# Patient Record
Sex: Female | Born: 2000 | Race: White | Hispanic: No | Marital: Single | Smoking: Never smoker
Health system: Southern US, Community
[De-identification: ages and names within clinical notes are randomized; demographics above are authoritative.]

## PROBLEM LIST (undated history)

## (undated) ENCOUNTER — Emergency Department (HOSPITAL_COMMUNITY): Payer: 59 | Source: Home / Self Care

## (undated) DIAGNOSIS — F329 Major depressive disorder, single episode, unspecified: Secondary | ICD-10-CM

## (undated) DIAGNOSIS — F32A Depression, unspecified: Secondary | ICD-10-CM

## (undated) HISTORY — PX: KNEE SURGERY: SHX244

## (undated) HISTORY — PX: WISDOM TOOTH EXTRACTION: SHX21

---
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
## 2018-08-06 IMAGING — CT CT ABD-PELV W/ CM
2 of 4 series · 16 of 46 positions shown, 18 images · IV contrast (iopamidol)
Comparison: None.

CLINICAL DATA: Periumbilical pain radiating to the right lower
quadrant.

EXAM:
CT ABDOMEN AND PELVIS WITH CONTRAST
TECHNIQUE: Multidetector CT imaging of the abdomen and pelvis was performed
using the standard protocol following bolus administration of
intravenous contrast.
CONTRAST:  100mL ETRQ8F-PYY IOPAMIDOL (ETRQ8F-PYY) INJECTION 61%

[Series 3: a/p w/ 5mm · axial · 0.88mm/px · z∈[+869,+1269]mm · 13 of 89 slices shown, 15 images]
[im 5/89  soft-tissue]
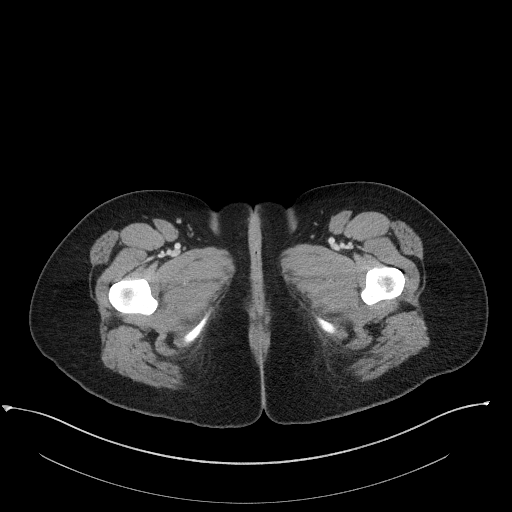
[im 5/89  bone]
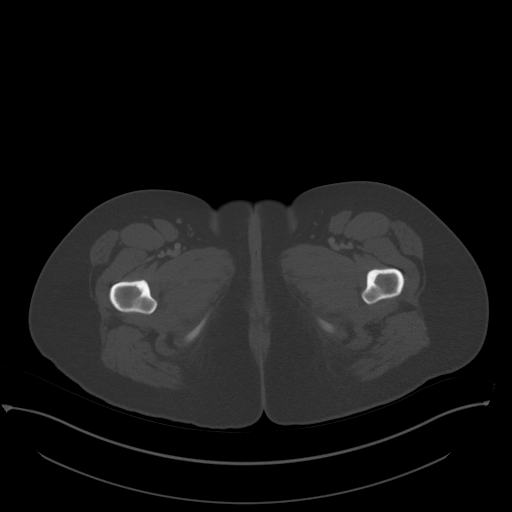
[im 13/89  soft-tissue]
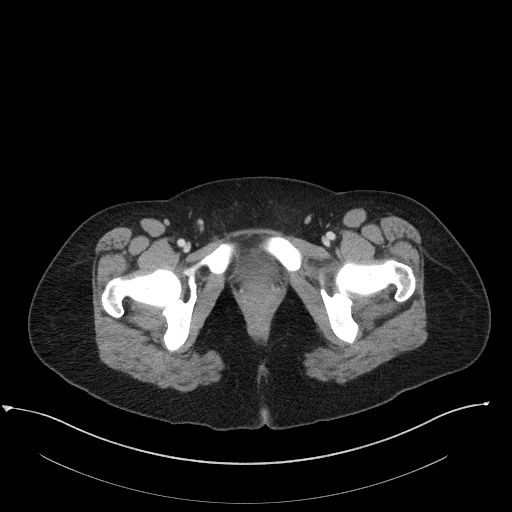
[im 21/89  soft-tissue]
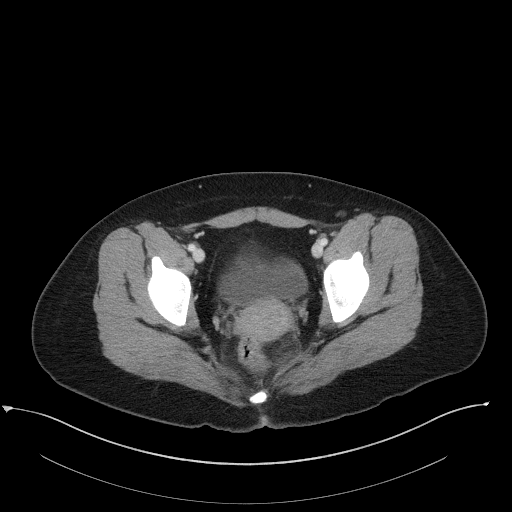
[im 25/89  soft-tissue]
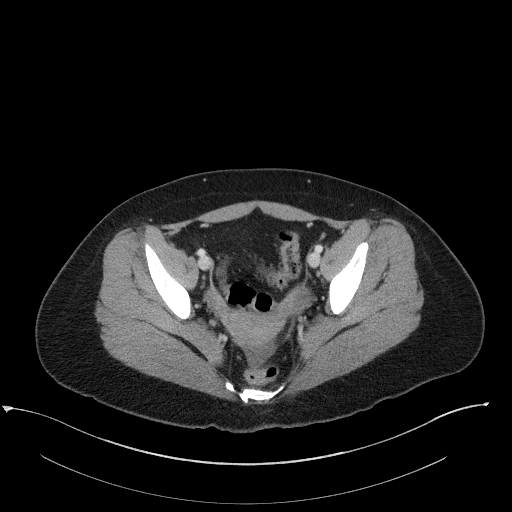
[im 33/89  soft-tissue]
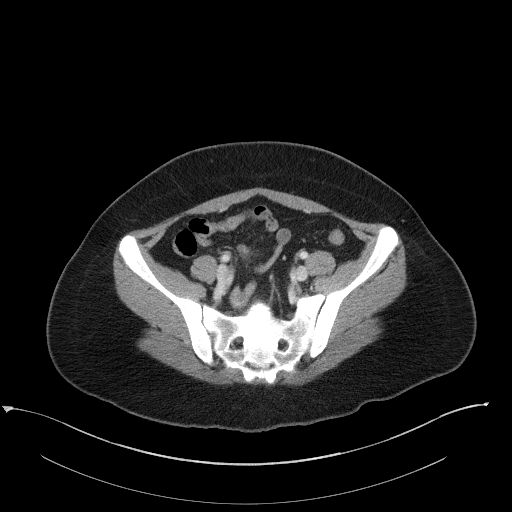
[im 37/89  soft-tissue]
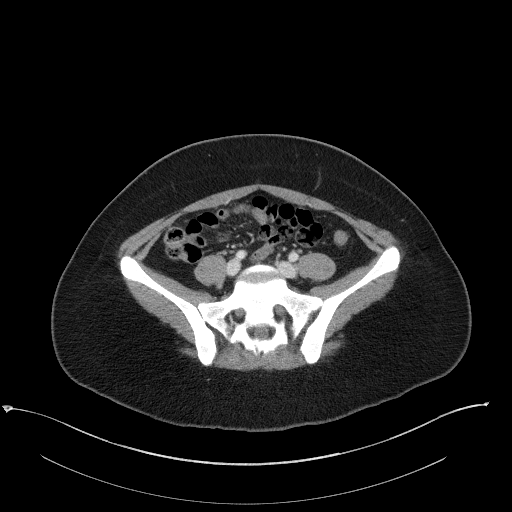
[im 45/89  soft-tissue]
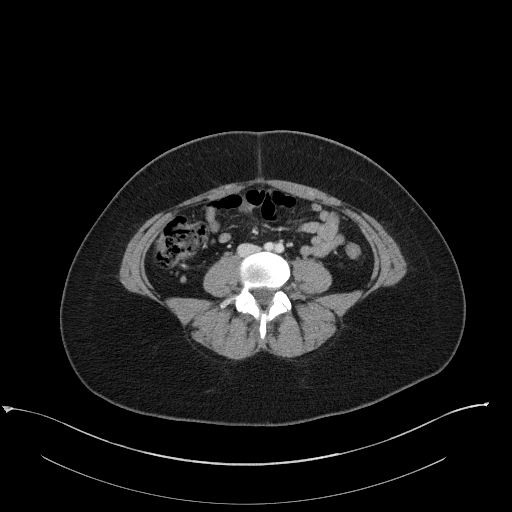
[im 53/89  soft-tissue]
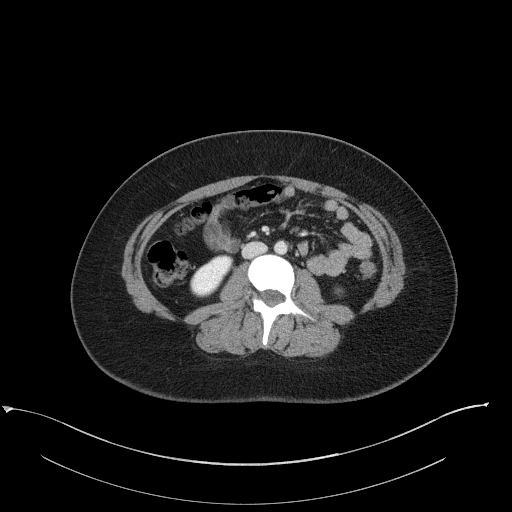
[im 57/89  soft-tissue]
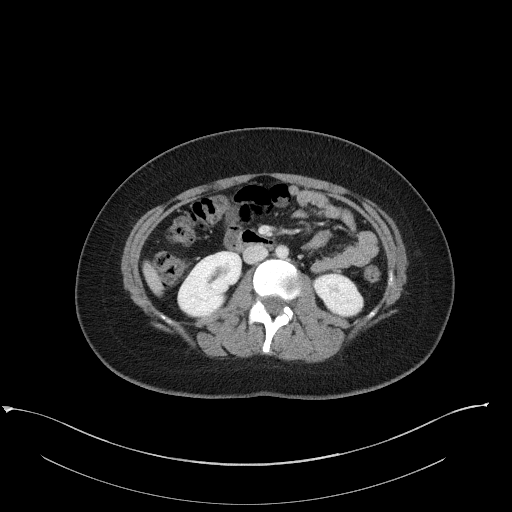
[im 57/89  bone]
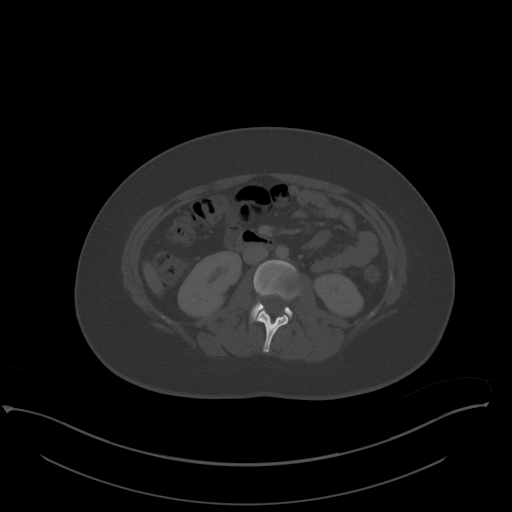
[im 65/89  soft-tissue]
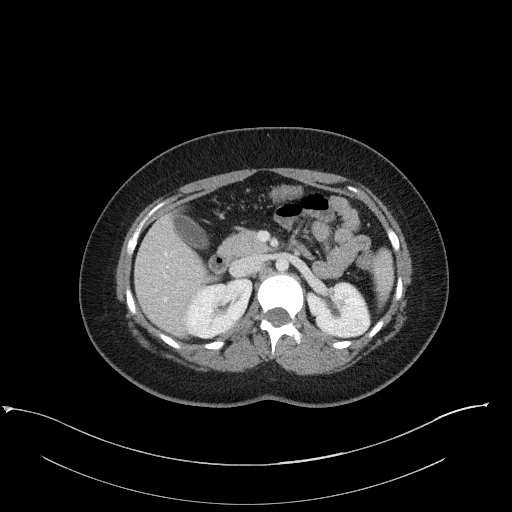
[im 69/89  soft-tissue]
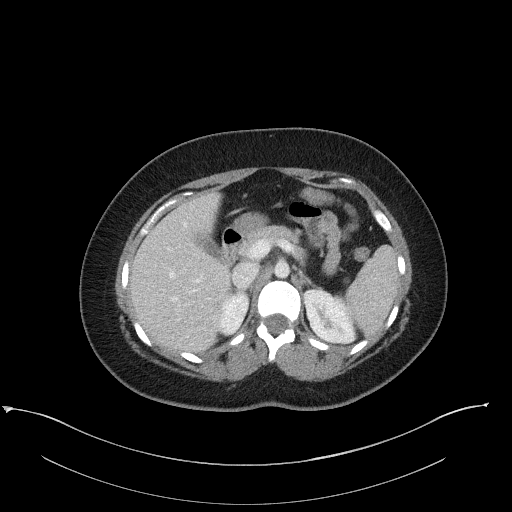
[im 77/89  soft-tissue]
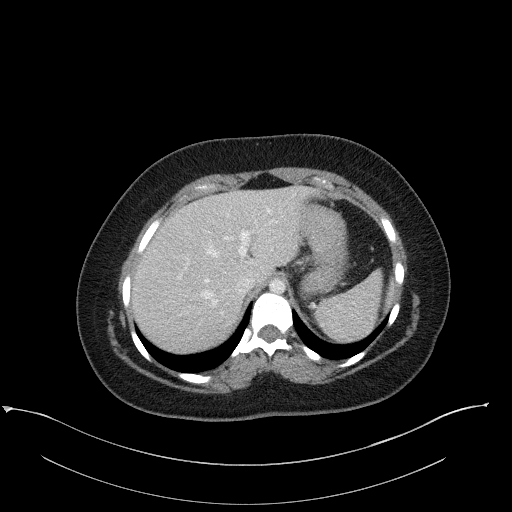
[im 85/89  soft-tissue]
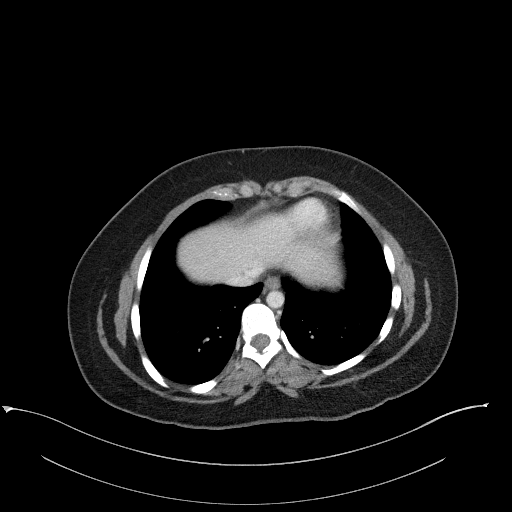

[Series 6: a/p w/ cor · coronal · 0.70mm/px · 3 of 138 slices shown]
[im 46/138  soft-tissue]
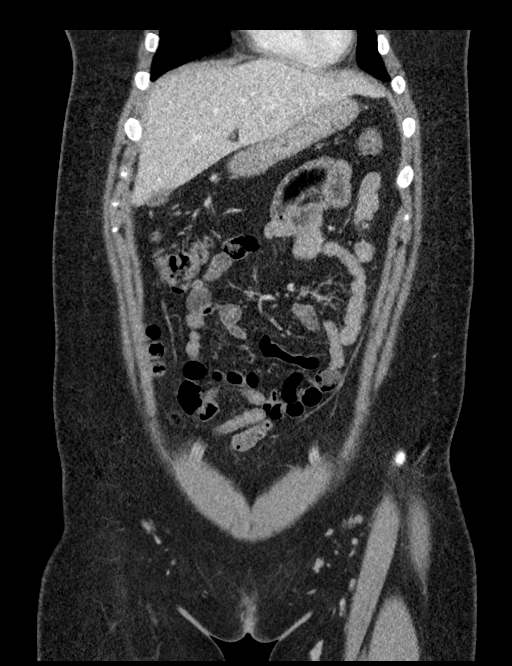
[im 61/138  soft-tissue]
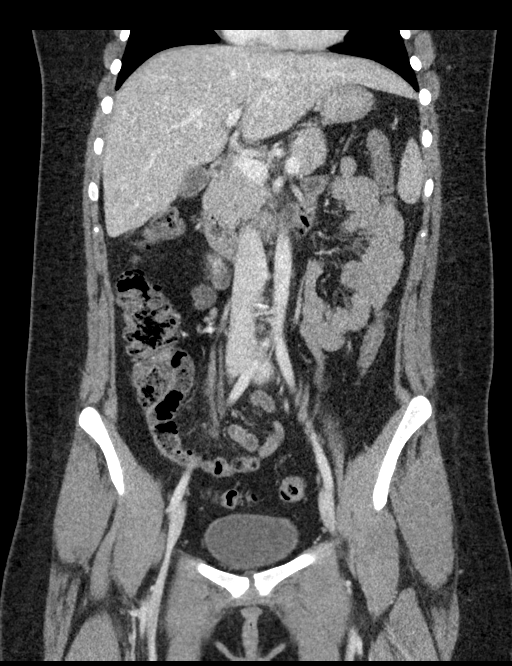
[im 77/138  soft-tissue]
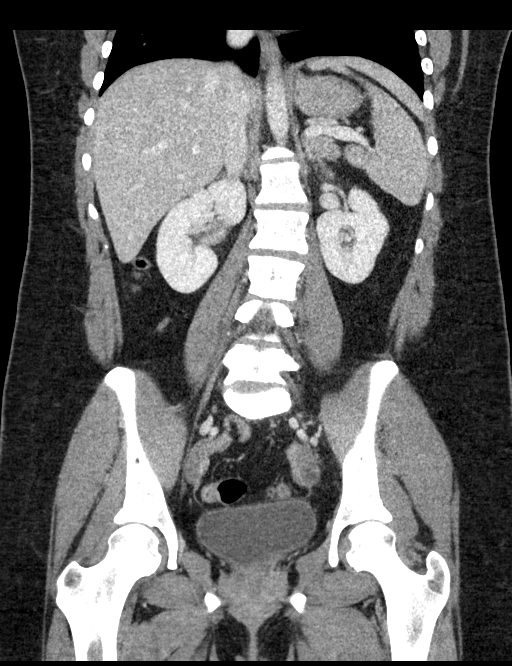

[16 of 46 positions shown; findings below may reference images not displayed]

FINDINGS: Lower chest: No acute abnormality.

Hepatobiliary: No focal liver abnormality is seen. No gallstones,
gallbladder wall thickening, or biliary dilatation.

Pancreas: Unremarkable. No pancreatic ductal dilatation or
surrounding inflammatory changes.

Spleen: Normal in size without focal abnormality.

Adrenals/Urinary Tract: Adrenal glands are unremarkable. Kidneys are
normal, without renal calculi, focal lesion, or hydronephrosis.
Bladder is unremarkable.

Stomach/Bowel: Stomach is within normal limits. The appendix is
fluid-filled and normal in caliber. Minimal mesenteric stranding at
the tip of the appendix, axial image 42/ 89, sequence 3 and coronal
image 79/138, sequence 6. No evidence of bowel wall thickening,
distention, or inflammatory changes.

Vascular/Lymphatic: No significant vascular findings are present. No
enlarged abdominal or pelvic lymph nodes. Shotty mesenteric lymph
nodes in the right lower quadrant and left upper quadrant of the
abdomen.

Reproductive: Uterus and bilateral adnexa are unremarkable.

Other: No abdominal wall hernia or abnormality. No abdominopelvic
ascites.

Musculoskeletal: No acute osseous findings. Mild levoconvex
scoliosis of the lumbosacral spine.
IMPRESSION: Normal caliber of the appendix with minimal mesenteric stranding at
the tip of the appendix. Very early tip appendicitis cannot be
entirely excluded.

Shotty mesenteric lymph nodes in the right lower quadrant and left
upper quadrant of the abdomen. Nonspecific finding which may be seen
with mild reactive enlargement of lymph nodes, or mesenteric
lymphadenitis.

## 2019-04-22 ENCOUNTER — Other Ambulatory Visit: Payer: Self-pay | Admitting: Physician Assistant

## 2019-04-22 DIAGNOSIS — R1011 Right upper quadrant pain: Secondary | ICD-10-CM

## 2019-05-01 ENCOUNTER — Ambulatory Visit
Admission: RE | Admit: 2019-05-01 | Discharge: 2019-05-01 | Disposition: A | Discharge status: Home / Self Care | Payer: No Typology Code available for payment source | Source: Ambulatory Visit | Attending: Physician Assistant | Admitting: Physician Assistant

## 2019-05-01 DIAGNOSIS — R1011 Right upper quadrant pain: Secondary | ICD-10-CM

## 2019-10-25 ENCOUNTER — Inpatient Hospital Stay (HOSPITAL_COMMUNITY)
Admission: AD | Admit: 2019-10-25 | Discharge: 2019-10-25 | Disposition: A | Discharge status: Home / Self Care | Payer: No Typology Code available for payment source | Attending: Obstetrics and Gynecology | Admitting: Obstetrics and Gynecology

## 2019-10-25 ENCOUNTER — Inpatient Hospital Stay (HOSPITAL_COMMUNITY): Payer: No Typology Code available for payment source

## 2019-10-25 ENCOUNTER — Encounter (HOSPITAL_COMMUNITY): Payer: Self-pay | Admitting: Obstetrics and Gynecology

## 2019-10-25 ENCOUNTER — Other Ambulatory Visit: Payer: Self-pay

## 2019-10-25 DIAGNOSIS — R109 Unspecified abdominal pain: Secondary | ICD-10-CM | POA: Diagnosis not present

## 2019-10-25 DIAGNOSIS — Z886 Allergy status to analgesic agent status: Secondary | ICD-10-CM | POA: Diagnosis not present

## 2019-10-25 DIAGNOSIS — O209 Hemorrhage in early pregnancy, unspecified: Secondary | ICD-10-CM | POA: Diagnosis not present

## 2019-10-25 DIAGNOSIS — Z3A11 11 weeks gestation of pregnancy: Secondary | ICD-10-CM | POA: Insufficient documentation

## 2019-10-25 DIAGNOSIS — O4690 Antepartum hemorrhage, unspecified, unspecified trimester: Secondary | ICD-10-CM

## 2019-10-25 DIAGNOSIS — O4691 Antepartum hemorrhage, unspecified, first trimester: Secondary | ICD-10-CM | POA: Diagnosis not present

## 2019-10-25 DIAGNOSIS — O26899 Other specified pregnancy related conditions, unspecified trimester: Secondary | ICD-10-CM

## 2019-10-25 DIAGNOSIS — Z6790 Unspecified blood type, Rh positive: Secondary | ICD-10-CM

## 2019-10-25 DIAGNOSIS — O3680X0 Pregnancy with inconclusive fetal viability, not applicable or unspecified: Secondary | ICD-10-CM

## 2019-10-25 DIAGNOSIS — O26891 Other specified pregnancy related conditions, first trimester: Secondary | ICD-10-CM | POA: Diagnosis not present

## 2019-10-25 DIAGNOSIS — O26851 Spotting complicating pregnancy, first trimester: Secondary | ICD-10-CM | POA: Diagnosis present

## 2019-10-25 LAB — URINALYSIS, ROUTINE W REFLEX MICROSCOPIC
Bilirubin Urine: NEGATIVE
Glucose, UA: NEGATIVE mg/dL
Ketones, ur: NEGATIVE mg/dL
Leukocytes,Ua: NEGATIVE
Nitrite: NEGATIVE
Protein, ur: NEGATIVE mg/dL
Specific Gravity, Urine: 1.006 (ref 1.005–1.030)
pH: 5 (ref 5.0–8.0)

## 2019-10-25 LAB — WET PREP, GENITAL
Clue Cells Wet Prep HPF POC: NONE SEEN
Sperm: NONE SEEN
Trich, Wet Prep: NONE SEEN
Yeast Wet Prep HPF POC: NONE SEEN

## 2019-10-25 LAB — CBC
HCT: 45.8 % (ref 36.0–46.0)
Hemoglobin: 15.1 g/dL — ABNORMAL HIGH (ref 12.0–15.0)
MCH: 29 pg (ref 26.0–34.0)
MCHC: 33 g/dL (ref 30.0–36.0)
MCV: 88.1 fL (ref 80.0–100.0)
Platelets: 317 10*3/uL (ref 150–400)
RBC: 5.2 MIL/uL — ABNORMAL HIGH (ref 3.87–5.11)
RDW: 11.6 % (ref 11.5–15.5)
WBC: 7.7 10*3/uL (ref 4.0–10.5)
nRBC: 0 % (ref 0.0–0.2)

## 2019-10-25 LAB — ABO/RH: ABO/RH(D): A POS

## 2019-10-25 LAB — HCG, QUANTITATIVE, PREGNANCY: hCG, Beta Chain, Quant, S: 1313 m[IU]/mL — ABNORMAL HIGH (ref <5)

## 2019-10-25 NOTE — Discharge Instructions (Signed)

## 2019-10-25 NOTE — MAU Note (Signed)
Rhonda Massey is a 19 y.o. here in MAU reporting: started cramping yesterday, thought she was just constipated. Last night when she went to the bathroom she noticed some bleeding, only when she wipes. It was pink, today it is brighter and saw some small clots. States she is wearing a panty liner but has not seen any on there. Last IC was yesterday. +UPT at the beginning of April.   LMP: 08/05/19  Onset of complaint: yesterday  Pain score: 3/10  Vitals:   10/25/19 1224  BP: 116/71  Pulse: 96  Resp: 16  Temp: 98.6 F (37 C)  SpO2: 99%     Lab orders placed from triage: UPT  Pt attempted to give urine sample prior to being triaged but was unable to. Pt has document saved on phone from the [facility]" in [place], has hcg drawn on 10/08/19 and it was 2575.

## 2019-10-25 NOTE — MAU Provider Note (Signed)
History     CSN: [id number]  Arrival date and time: 10/25/19 1148   First Provider Initiated Contact with Patient 10/25/19 1240      Chief Complaint  Patient presents with  . Possible Pregnancy  . Abdominal Pain  . Vaginal Bleeding   19 y.o. G2P0010 @[redacted]w[redacted]d  by unsure LMP presenting with cramping and spotting. Reports onset yesterday. She felt the cramping was d/t constipation and has improved after having a BM today. Spotting has been light and is only when she wipes. Last had sex this am. Denies urinary sx. No vaginal discharge. No fevers. She was seen at [facility] and had 06-26-1971 with showed a possible gestational sac, and had qhcg of 2500 (pt had results on phone).   OB History    Gravida  2   Para      Term      Preterm      AB  1   Living        SAB  1   TAB      Ectopic      Multiple      Live Births              Past Medical History:  Diagnosis Date  . Depression Knee Dysplasia    Past Surgical History:  Procedure Laterality Date  . LAPAROSCOPIC APPENDECTOMY N/A 03/02/2017   Procedure: APPENDECTOMY LAPAROSCOPIC;  Surgeon: 03/04/2017, MD;  Location: [facility] OR;  Service: General;  Laterality: N/A;  . WISDOM TOOTH EXTRACTION      Family History  Problem Relation Age of Onset  . Cancer Maternal Grandmother        breast    Social History   Tobacco Use  . Smoking status: Never Smoker  . Smokeless tobacco: Never Used  Substance Use Topics  . Alcohol use: No  . Drug use: No    Allergies:  Allergies  Allergen Reactions  . Ibuprofen Nausea And Vomiting    Medications Prior to Admission  Medication Sig Dispense Refill Last Dose  . Norgestimate-Ethinyl Estradiol Triphasic (TRI-SPRINTEC) 0.18/0.215/0.25 MG-35 MCG tablet Take 1 tablet by mouth daily.     . traMADol (ULTRAM) 50 MG tablet Take 1 tablet (50 mg total) by mouth every 6 (six) hours as needed for moderate pain. 15 tablet 0     Review of Systems   Constitutional: Negative for chills and fever.  Gastrointestinal: Positive for abdominal pain.  Genitourinary: Positive for vaginal bleeding. Negative for dysuria and vaginal discharge.   Physical Exam   Blood pressure 116/71, pulse 96, temperature 98.6 F (37 C), temperature source Oral, resp. rate 16, height 5\' 1"  (1.549 m), weight 73.7 kg, last menstrual period 08/05/2019, SpO2 99 %.  Physical Exam  Nursing note and vitals reviewed. Constitutional: She is oriented to person, place, and time. She appears well-developed and well-nourished. No distress.  HENT:  Head: Normocephalic and atraumatic.  Cardiovascular: Normal rate.  Respiratory: Effort normal. No respiratory distress.  GI: Soft. She exhibits no distension and no mass. There is no abdominal tenderness. There is no rebound and no guarding.  Genitourinary:    Genitourinary Comments: External: no lesions or erythema Uterus: non enlarged, anteverted, non tender, no CMT Adnexae: no masses, no tenderness left, no tenderness right Cervix closed   Musculoskeletal:        General: Normal range of motion.     Cervical back: Normal range of motion.  Neurological: She is alert and oriented to  person, place, and time.  Skin: Skin is warm and dry.  Psychiatric: She has a normal mood and affect.   Results for orders placed or performed during the hospital encounter of 10/25/19 (from the past 24 hour(s))  CBC     Status: Abnormal   Collection Time: 10/25/19 12:43 PM  Result Value Ref Range   WBC 7.7 4.0 - 10.5 K/uL   RBC 5.20 (H) 3.87 - 5.11 MIL/uL   Hemoglobin 15.1 (H) 12.0 - 15.0 g/dL   HCT 45.8 36.0 - 46.0 %   MCV 88.1 80.0 - 100.0 fL   MCH 29.0 26.0 - 34.0 pg   MCHC 33.0 30.0 - 36.0 g/dL   RDW 11.6 11.5 - 15.5 %   Platelets 317 150 - 400 K/uL   nRBC 0.0 0.0 - 0.2 %  hCG, quantitative, pregnancy     Status: Abnormal   Collection Time: 10/25/19 12:43 PM  Result Value Ref Range   hCG, Beta Chain, Quant, S 1,313 (H) <5  mIU/mL  ABO/Rh     Status: None   Collection Time: 10/25/19 12:43 PM  Result Value Ref Range   ABO/RH(D) A POS    No rh immune globuloin      NOT A RH IMMUNE GLOBULIN CANDIDATE, PT RH POSITIVE Performed at [facility], [place] [address], [place], [place] [address]   Wet prep, genital     Status: Abnormal   Collection Time: 10/25/19 12:55 PM   Specimen: PATH Cytology Cervicovaginal Ancillary Only  Result Value Ref Range   Yeast Wet Prep HPF POC NONE SEEN NONE SEEN   Trich, Wet Prep NONE SEEN NONE SEEN   Clue Cells Wet Prep HPF POC NONE SEEN NONE SEEN   WBC, Wet Prep HPF POC MANY (A) NONE SEEN   Sperm NONE SEEN   Urinalysis, Routine w reflex microscopic     Status: Abnormal   Collection Time: 10/25/19  1:23 PM  Result Value Ref Range   Color, Urine STRAW (A) YELLOW   APPearance CLEAR CLEAR   Specific Gravity, Urine 1.006 1.005 - 1.030   pH 5.0 5.0 - 8.0   Glucose, UA NEGATIVE NEGATIVE mg/dL   Hgb urine dipstick MODERATE (A) NEGATIVE   Bilirubin Urine NEGATIVE NEGATIVE   Ketones, ur NEGATIVE NEGATIVE mg/dL   Protein, ur NEGATIVE NEGATIVE mg/dL   Nitrite NEGATIVE NEGATIVE   Leukocytes,Ua NEGATIVE NEGATIVE   RBC / HPF 0-5 0 - 5 RBC/hpf   WBC, UA 0-5 0 - 5 WBC/hpf   Bacteria, UA RARE (A) NONE SEEN   Squamous Epithelial / LPF 0-5 0 - 5   Mucus PRESENT    US OB LESS THAN 14 WEEKS WITH OB TRANSVAGINAL  Result Date: 10/25/2019 CLINICAL DATA:  Vaginal bleeding and cramping. EXAM: OBSTETRIC <14 WK [place] AND TRANSVAGINAL OB US TECHNIQUE: Both transabdominal and transvaginal ultrasound examinations were performed for complete evaluation of the gestation as well as the maternal uterus, adnexal regions, and pelvic cul-de-sac. Transvaginal technique was performed to assess early pregnancy. COMPARISON:  None. FINDINGS: Intrauterine gestational sac: None Yolk sac:  N/A Embryo:  N/A Cardiac Activity: N/A Heart Rate: N/A bpm Subchorionic hemorrhage:  N/A Maternal uterus/adnexae: Both ovaries are  normal.  No adnexal mass. Moderate thickening of the endometrium measuring up to 14.6 mm no endometrial fluid. The cervix appears normal. IMPRESSION: 1. No intrauterine gestational sac is identified. 2. Normal ovaries.  No adnexal mass. Electronically Signed   By: Marijo Sanes M.D.   On:  10/25/2019 13:53   MAU Course  Procedures  MDM Labs and [place] ordered and reviewed. No IUP seen on [place] and given qhcg is lower today than when checked at outside facility (not on file) I suspect failed pregnancy but cannot r/o early pregnancy or ectopic pregnancy. Discussed findings with pt. Plan for f/u qhcg in 2 days in MAU (pt has to work during clinic hours). Stable for discharge home.   Assessment and Plan   1. Pregnancy, location unknown   2. Vaginal bleeding in pregnancy   3. Abdominal cramping affecting pregnancy   4. Blood type, Rh positive    Discharge home Follow up at MAU on 10/27/19 for qhcg SAB/ectopic return precautions Tylenol/heating pad prn  Allergies as of 10/25/2019      Reactions   Ibuprofen Nausea And Vomiting      Medication List    STOP taking these medications   traMADol 50 MG tablet Commonly known as: ULTRAM   Tri-Sprintec 0.18/0.215/0.25 MG-35 MCG tablet Generic drug: Norgestimate-Ethinyl Estradiol Triphasic      Donette Larry, CNM 10/25/2019, 12:41 PM

## 2019-10-27 ENCOUNTER — Inpatient Hospital Stay (HOSPITAL_COMMUNITY)
Admission: AD | Admit: 2019-10-27 | Discharge: 2019-10-27 | Disposition: A | Discharge status: Home / Self Care | Payer: No Typology Code available for payment source | Attending: Obstetrics & Gynecology | Admitting: Obstetrics & Gynecology

## 2019-10-27 DIAGNOSIS — O039 Complete or unspecified spontaneous abortion without complication: Secondary | ICD-10-CM | POA: Insufficient documentation

## 2019-10-27 DIAGNOSIS — Z6790 Unspecified blood type, Rh positive: Secondary | ICD-10-CM

## 2019-10-27 DIAGNOSIS — Z3A11 11 weeks gestation of pregnancy: Secondary | ICD-10-CM | POA: Diagnosis not present

## 2019-10-27 LAB — HCG, QUANTITATIVE, PREGNANCY: hCG, Beta Chain, Quant, S: 128 m[IU]/mL — ABNORMAL HIGH (ref <5)

## 2019-10-27 LAB — GC/CHLAMYDIA PROBE AMP (~~LOC~~) NOT AT ARMC
Chlamydia: NEGATIVE
Comment: NEGATIVE
Comment: NORMAL
Neisseria Gonorrhea: NEGATIVE

## 2019-10-27 NOTE — MAU Note (Signed)
.   Rhonda Massey is a 19 y.o. at [redacted]w[redacted]d here in MAU reporting: she is here for repeat labs. Continues to have small amount of vaginal bleeding with lower abdominal cramping LMP: 08/05/19 Onset of complaint: ongoing Pain score: 7 Vitals:   10/27/19 1630  BP: 123/74  Pulse: 92  Resp: 18  Temp: 98.9 F (37.2 C)  SpO2: 100%     FHT Lab orders placed from triage: QUANT

## 2019-10-27 NOTE — Discharge Instructions (Signed)
Managing Pregnancy Loss Pregnancy loss can happen any time during a pregnancy. Often the cause is not known. It is rarely because of anything you did. Pregnancy loss in early pregnancy (during the first trimester) is called a miscarriage. This type of pregnancy loss is the most common. Pregnancy loss that happens after 20 weeks of pregnancy is called fetal demise if the baby's heart stops beating before birth. Fetal demise is much less common. Some women experience spontaneous labor shortly after fetal demise resulting in a stillborn birth (stillbirth). Any pregnancy loss can be devastating. You will need to recover both physically and emotionally. Most women are able to get pregnant again after a pregnancy loss and deliver a healthy baby. How to manage emotional recovery  Pregnancy loss is very hard emotionally. You may feel many different emotions while you grieve. You may feel sad and angry. You may also feel guilty. It is normal to have periods of crying. Emotional recovery can take longer than physical recovery. It is different for everyone. Taking these steps can help you in managing this loss:  Remember that it is unlikely you did anything to cause the pregnancy loss.  Share your thoughts and feelings with friends, family, and your partner. Remember that your partner is also recovering emotionally.  Make sure you have a good support system. Do not spend too much time alone.  Meet with a pregnancy loss counselor or join a pregnancy loss support group.  Get enough sleep and eat a healthy diet. Return to regular exercise when you have recovered physically.  Do not use drugs or alcohol to manage your emotions.  Consider seeing a mental health professional to help you recover emotionally.  Ask a friend or loved one to help you decide what to do with any clothing and nursery items you received for your baby. In the case of a stillbirth, many women benefit from taking additional steps in the  grieving process. You may want to:  Hold your baby after the birth.  Name your baby.  Request a birth certificate.  Create a keepsake such as handprints or footprints.  Dress your baby and have a picture taken.  Make funeral arrangements.  Ask for a baptism or blessing. Hospitals have staff members who can help you with all these arrangements. How to recognize emotional stress It is normal to have emotional stress after a pregnancy loss. But emotional stress that lasts a long time or becomes severe requires treatment. Watch out for these signs of severe emotional stress:  Sadness, anger, or guilt that is not going away and is interfering with your normal activities.  Relationship problems that have occurred or gotten worse since the pregnancy loss.  Signs of depression that last longer than 2 weeks. These may include: ? Sadness. ? Anxiety. ? Hopelessness. ? Loss of interest in activities you enjoy. ? Inability to concentrate. ? Trouble sleeping or sleeping too much. ? Loss of appetite or overeating. ? Thoughts of death or of hurting yourself. Follow these instructions at home:  Take over-the-counter and prescription medicines only as told by your health care provider.  Rest at home until your energy level returns. Return to your normal activities as told by your health care provider. Ask your health care provider what activities are safe for you.  When you are ready, meet with your health care provider to discuss steps to take for a future pregnancy.  Keep all follow-up visits as told by your health care provider. This is important.  Where to find support  To help you and your partner with the process of grieving, talk with your health care provider or seek counseling.  Consider meeting with others who have experienced pregnancy loss. Ask your health care provider about support groups and resources. Where to find more information  [place] Department of Health and Human  Services Office on Women's Health: www.womenshealth.gov  American Pregnancy Association: www.americanpregnancy.org Contact a health care provider if:  You continue to experience grief, sadness, or lack of motivation for everyday activities, and those feelings do not improve over time.  You are struggling to recover emotionally, especially if you are using alcohol or substances to help. Get help right away if:  You have thoughts of hurting yourself or others. If you ever feel like you may hurt yourself or others, or have thoughts about taking your own life, get help right away. You can go to your nearest emergency department or call:  Your local emergency services (911 in the [place]  A suicide crisis helpline, such as the National Suicide Prevention Lifeline at 1-800-273-8255. This is open 24 hours a day. Summary  Any pregnancy loss can be difficult physically and emotionally.  You may experience many different emotions while you grieve. Emotional recovery can last longer than physical recovery.  It is normal to have emotional stress after a pregnancy loss. But emotional stress that lasts a long time or becomes severe requires treatment.  See your health care provider if you are struggling emotionally after a pregnancy loss. This information is not intended to replace advice given to you by your health care provider. Make sure you discuss any questions you have with your health care provider. Document Revised: 10/09/2018 Document Reviewed: 08/30/2017 Elsevier Patient Education  2020 Elsevier Inc.      Miscarriage A miscarriage is the loss of an unborn baby (fetus) before the 20th week of pregnancy. Most miscarriages happen during the first 3 months of pregnancy. Sometimes, a miscarriage can happen before a woman knows that she is pregnant. Having a miscarriage can be an emotional experience. If you have had a miscarriage, talk with your health care provider about any questions you  may have about miscarrying, the grieving process, and your plans for future pregnancy. What are the causes? A miscarriage may be caused by:  Problems with the genes or chromosomes of the fetus. These problems make it impossible for the baby to develop normally. They are often the result of random errors that occur early in the development of the baby, and are not passed from parent to child (not inherited).  Infection of the cervix or uterus.  Conditions that affect hormone balance in the body.  Problems with the cervix, such as the cervix opening and thinning before pregnancy is at term (cervical insufficiency).  Problems with the uterus. These may include: ? A uterus with an abnormal shape. ? Fibroids in the uterus. ? Congenital abnormalities. These are problems that were present at birth.  Certain medical conditions.  Smoking, drinking alcohol, or using drugs.  Injury (trauma). In many cases, the cause of a miscarriage is not known. What are the signs or symptoms? Symptoms of this condition include:  Vaginal bleeding or spotting, with or without cramps or pain.  Pain or cramping in the abdomen or lower back.  Passing fluid, tissue, or blood clots from the vagina. How is this diagnosed? This condition may be diagnosed based on:  A physical exam.  Ultrasound.  Blood tests.  Urine tests. How   is this treated? Treatment for a miscarriage is sometimes not necessary if you naturally pass all the tissue that was in your uterus. If necessary, this condition may be treated with:  Dilation and curettage (D&C). This is a procedure in which the cervix is stretched open and the lining of the uterus (endometrium) is scraped. This is done only if tissue from the fetus or placenta remains in the body (incomplete miscarriage).  Medicines, such as: ? Antibiotic medicine, to treat infection. ? Medicine to help the body pass any remaining tissue. ? Medicine to reduce (contract) the  size of the uterus. These medicines may be given if you have a lot of bleeding. If you have Rh negative blood and your baby was Rh positive, you will need a shot of a medicine called Rh immunoglobulinto protect your future babies from Rh blood problems. "Rh-negative" and "Rh-positive" refer to whether or not the blood has a specific protein found on the surface of red blood cells (Rh factor). Follow these instructions at home: Medicines   Take over-the-counter and prescription medicines only as told by your health care provider.  If you were prescribed antibiotic medicine, take it as told by your health care provider. Do not stop taking the antibiotic even if you start to feel better.  Do not take NSAIDs, such as aspirin and ibuprofen, unless they are approved by your health care provider. These medicines can cause bleeding. Activity  Rest as directed. Ask your health care provider what activities are safe for you.  Have someone help with home and family responsibilities during this time. General instructions  Keep track of the number of sanitary pads you use each day and how soaked (saturated) they are. Write down this information.  Monitor the amount of tissue or blood clots that you pass from your vagina. Save any large amounts of tissue for your health care provider to examine.  Do not use tampons, douche, or have sex until your health care provider approves.  To help you and your partner with the process of grieving, talk with your health care provider or seek counseling.  When you are ready, meet with your health care provider to discuss any important steps you should take for your health. Also, discuss steps you should take to have a healthy pregnancy in the future.  Keep all follow-up visits as told by your health care provider. This is important. Where to find more information  The American Congress of Obstetricians and Gynecologists: www.acog.org  [place]. Department of Health  and Human Services Office of Women's Health: www.womenshealth.gov Contact a health care provider if:  You have a fever or chills.  You have a foul smelling vaginal discharge.  You have more bleeding instead of less. Get help right away if:  You have severe cramps or pain in your back or abdomen.  You pass blood clots or tissue from your vagina that is walnut-sized or larger.  You soak more than 1 regular sanitary pad in an hour.  You become light-headed or weak.  You pass out.  You have feelings of sadness that take over your thoughts, or you have thoughts of hurting yourself. Summary  Most miscarriages happen in the first 3 months of pregnancy. Sometimes miscarriage happens before a woman even knows that she is pregnant.  Follow your health care provider's instruction for home care. Keep all follow-up appointments.  To help you and your partner with the process of grieving, talk with your health care provider or seek   counseling. This information is not intended to replace advice given to you by your health care provider. Make sure you discuss any questions you have with your health care provider. Document Revised: 10/11/2018 Document Reviewed: 07/25/2016 Elsevier Patient Education  2020 Elsevier Inc.  

## 2019-10-27 NOTE — MAU Provider Note (Signed)
Subjective:  Rhonda Massey is a 19 y.o. G2P0010 at [redacted]w[redacted]d who presents today for FU BHCG. She was seen on 10/25/2019. Results from that day show no IUP on [place], and HCG 1,313. She reports vaginal bleeding. She reports abdominal or pelvic pain.   Objective:  Physical Exam  Nursing note and vitals reviewed.  Patient Vitals for the past 24 hrs:  BP Temp Pulse Resp SpO2  10/27/19 1630 123/74 98.9 F (37.2 C) 92 18 100 %   Constitutional: She is oriented to person, place, and time. She appears well-developed and well-nourished. No distress.  HENT:  Head: Normocephalic.  Cardiovascular: Normal rate.  Respiratory: Effort normal.  GI: Soft. There is no tenderness.  Neurological: She is alert and oriented to person, place, and time. Skin: Skin is warm and dry.  Psychiatric: She has a normal mood and affect.   Results for orders placed or performed during the hospital encounter of 10/27/19 (from the past 24 hour(s))  hCG, quantitative, pregnancy     Status: Abnormal   Collection Time: 10/27/19  4:37 PM  Result Value Ref Range   hCG, Beta Chain, Quant, S 128 (H) <5 mIU/mL    Assessment/Plan: Miscarriage HCG dropped significantly FU in 1 weeks for: repeat hCG and 2 weeks for repeat hCG and provider visit Message sent to [facility] to schedule Discussed medication use for pain at home Return MAU precautions given Pt discharged to home in stable condition

## 2019-11-04 ENCOUNTER — Other Ambulatory Visit: Payer: No Typology Code available for payment source

## 2019-11-11 ENCOUNTER — Ambulatory Visit: Payer: No Typology Code available for payment source | Admitting: Advanced Practice Midwife

## 2020-01-28 LAB — OB RESULTS CONSOLE HEPATITIS B SURFACE ANTIGEN: Hepatitis B Surface Ag: NEGATIVE

## 2020-01-28 LAB — OB RESULTS CONSOLE HIV ANTIBODY (ROUTINE TESTING): HIV: NONREACTIVE

## 2020-01-28 LAB — OB RESULTS CONSOLE ABO/RH: RH Type: POSITIVE

## 2020-01-28 LAB — OB RESULTS CONSOLE RUBELLA ANTIBODY, IGM: Rubella: IMMUNE

## 2020-01-28 LAB — OB RESULTS CONSOLE GC/CHLAMYDIA
Chlamydia: NEGATIVE
Gonorrhea: NEGATIVE

## 2020-01-28 LAB — HEPATITIS C ANTIBODY: HCV Ab: NEGATIVE

## 2020-01-28 LAB — OB RESULTS CONSOLE RPR: RPR: NONREACTIVE

## 2020-01-28 LAB — OB RESULTS CONSOLE ANTIBODY SCREEN: Antibody Screen: NEGATIVE

## 2020-02-02 LAB — OB RESULTS CONSOLE RUBELLA ANTIBODY, IGM: Rubella: IMMUNE

## 2020-02-05 DIAGNOSIS — Z91419 Personal history of unspecified adult abuse: Secondary | ICD-10-CM | POA: Insufficient documentation

## 2020-02-05 HISTORY — DX: Personal history of unspecified adult abuse: Z91.419

## 2020-02-10 ENCOUNTER — Ambulatory Visit: Payer: No Typology Code available for payment source

## 2020-02-10 DIAGNOSIS — Z3490 Encounter for supervision of normal pregnancy, unspecified, unspecified trimester: Secondary | ICD-10-CM

## 2020-02-10 NOTE — Progress Notes (Signed)
No Answer for NOB Intake.

## 2020-02-17 ENCOUNTER — Encounter: Payer: No Typology Code available for payment source | Admitting: Obstetrics and Gynecology

## 2020-06-27 ENCOUNTER — Inpatient Hospital Stay (HOSPITAL_COMMUNITY): Admit: 2020-06-27 | Payer: Self-pay

## 2020-07-15 ENCOUNTER — Other Ambulatory Visit: Payer: Self-pay

## 2020-07-15 ENCOUNTER — Encounter (HOSPITAL_COMMUNITY): Payer: Self-pay

## 2020-07-15 ENCOUNTER — Emergency Department (HOSPITAL_COMMUNITY): Payer: 59

## 2020-07-15 ENCOUNTER — Inpatient Hospital Stay (HOSPITAL_BASED_OUTPATIENT_CLINIC_OR_DEPARTMENT_OTHER): Payer: 59

## 2020-07-15 ENCOUNTER — Inpatient Hospital Stay (HOSPITAL_COMMUNITY)
Admission: EM | Admit: 2020-07-15 | Discharge: 2020-07-16 | Disposition: A | Discharge status: Home / Self Care | Payer: 59 | Attending: Obstetrics & Gynecology | Admitting: Obstetrics & Gynecology

## 2020-07-15 DIAGNOSIS — O9A219 Injury, poisoning and certain other consequences of external causes complicating pregnancy, unspecified trimester: Secondary | ICD-10-CM | POA: Diagnosis not present

## 2020-07-15 DIAGNOSIS — O98513 Other viral diseases complicating pregnancy, third trimester: Secondary | ICD-10-CM | POA: Diagnosis not present

## 2020-07-15 DIAGNOSIS — Z3A33 33 weeks gestation of pregnancy: Secondary | ICD-10-CM

## 2020-07-15 DIAGNOSIS — O26893 Other specified pregnancy related conditions, third trimester: Secondary | ICD-10-CM

## 2020-07-15 DIAGNOSIS — T1490XA Injury, unspecified, initial encounter: Secondary | ICD-10-CM | POA: Diagnosis not present

## 2020-07-15 DIAGNOSIS — R109 Unspecified abdominal pain: Secondary | ICD-10-CM | POA: Diagnosis not present

## 2020-07-15 DIAGNOSIS — S3991XA Unspecified injury of abdomen, initial encounter: Secondary | ICD-10-CM | POA: Diagnosis not present

## 2020-07-15 DIAGNOSIS — R079 Chest pain, unspecified: Secondary | ICD-10-CM

## 2020-07-15 DIAGNOSIS — O9A213 Injury, poisoning and certain other consequences of external causes complicating pregnancy, third trimester: Secondary | ICD-10-CM | POA: Insufficient documentation

## 2020-07-15 DIAGNOSIS — U071 COVID-19: Secondary | ICD-10-CM | POA: Insufficient documentation

## 2020-07-15 DIAGNOSIS — S199XXA Unspecified injury of neck, initial encounter: Secondary | ICD-10-CM | POA: Diagnosis not present

## 2020-07-15 DIAGNOSIS — S299XXA Unspecified injury of thorax, initial encounter: Secondary | ICD-10-CM | POA: Diagnosis not present

## 2020-07-15 DIAGNOSIS — Z041 Encounter for examination and observation following transport accident: Secondary | ICD-10-CM | POA: Diagnosis not present

## 2020-07-15 DIAGNOSIS — R519 Headache, unspecified: Secondary | ICD-10-CM | POA: Diagnosis not present

## 2020-07-15 DIAGNOSIS — Y92410 Unspecified street and highway as the place of occurrence of the external cause: Secondary | ICD-10-CM | POA: Insufficient documentation

## 2020-07-15 DIAGNOSIS — S0990XA Unspecified injury of head, initial encounter: Secondary | ICD-10-CM | POA: Diagnosis not present

## 2020-07-15 LAB — COMPREHENSIVE METABOLIC PANEL
ALT: 41 U/L (ref 0–44)
AST: 34 U/L (ref 15–41)
Albumin: 2.7 g/dL — ABNORMAL LOW (ref 3.5–5.0)
Alkaline Phosphatase: 100 U/L (ref 38–126)
Anion gap: 11 (ref 5–15)
BUN: 7 mg/dL (ref 6–20)
CO2: 19 mmol/L — ABNORMAL LOW (ref 22–32)
Calcium: 8.8 mg/dL — ABNORMAL LOW (ref 8.9–10.3)
Chloride: 107 mmol/L (ref 98–111)
Creatinine, Ser: 0.39 mg/dL — ABNORMAL LOW (ref 0.44–1.00)
GFR, Estimated: >60 mL/min (ref >60)
Glucose, Bld: 102 mg/dL — ABNORMAL HIGH (ref 70–99)
Potassium: 3.9 mmol/L (ref 3.5–5.1)
Sodium: 137 mmol/L (ref 135–145)
Total Bilirubin: 0.6 mg/dL (ref 0.3–1.2)
Total Protein: 6.2 g/dL — ABNORMAL LOW (ref 6.5–8.1)

## 2020-07-15 LAB — PROTIME-INR
INR: 0.9 (ref 0.8–1.2)
Prothrombin Time: 11.7 seconds (ref 11.4–15.2)

## 2020-07-15 LAB — I-STAT CHEM 8, ED
BUN: 8 mg/dL (ref 6–20)
Calcium, Ion: 1.08 mmol/L — ABNORMAL LOW (ref 1.15–1.40)
Chloride: 107 mmol/L (ref 98–111)
Creatinine, Ser: 0.3 mg/dL — ABNORMAL LOW (ref 0.44–1.00)
Glucose, Bld: 101 mg/dL — ABNORMAL HIGH (ref 70–99)
HCT: 37 % (ref 36.0–46.0)
Hemoglobin: 12.6 g/dL (ref 12.0–15.0)
Potassium: 4.5 mmol/L (ref 3.5–5.1)
Sodium: 137 mmol/L (ref 135–145)
TCO2: 22 mmol/L (ref 22–32)

## 2020-07-15 LAB — CBC
HCT: 38.5 % (ref 36.0–46.0)
Hemoglobin: 12.8 g/dL (ref 12.0–15.0)
MCH: 29.4 pg (ref 26.0–34.0)
MCHC: 33.2 g/dL (ref 30.0–36.0)
MCV: 88.3 fL (ref 80.0–100.0)
Platelets: 277 10*3/uL (ref 150–400)
RBC: 4.36 MIL/uL (ref 3.87–5.11)
RDW: 12 % (ref 11.5–15.5)
WBC: 9.5 10*3/uL (ref 4.0–10.5)
nRBC: 0 % (ref 0.0–0.2)

## 2020-07-15 LAB — KLEIHAUER-BETKE STAIN
Fetal Cells %: 0 %
Quantitation Fetal Hemoglobin: 0 mL

## 2020-07-15 LAB — ETHANOL: Alcohol, Ethyl (B): <10 mg/dL (ref <10)

## 2020-07-15 LAB — TYPE AND SCREEN
ABO/RH(D): A POS
Antibody Screen: NEGATIVE

## 2020-07-15 LAB — ABO/RH: ABO/RH(D): A POS

## 2020-07-15 LAB — I-STAT BETA HCG BLOOD, ED (MC, WL, AP ONLY): I-stat hCG, quantitative: >2000 m[IU]/mL — ABNORMAL HIGH (ref <5)

## 2020-07-15 LAB — LACTIC ACID, PLASMA: Lactic Acid, Venous: 1 mmol/L (ref 0.5–1.9)

## 2020-07-15 MED ORDER — SODIUM CHLORIDE 0.9 % IV BOLUS: 500.0000 mL | Freq: Once | INTRAVENOUS | Status: DC

## 2020-07-15 MED ORDER — NIFEDIPINE 10 MG PO CAPS: 10.0000 mg | ORAL_CAPSULE | ORAL | Status: DC | PRN

## 2020-07-15 MED ORDER — CYCLOBENZAPRINE HCL 5 MG PO TABS
10.0000 mg | ORAL_TABLET | Freq: Once | ORAL | Status: AC
  Administered 2020-07-15: 10 mg via ORAL
  Filled 2020-07-15: qty 2

## 2020-07-15 MED ORDER — IOHEXOL 300 MG/ML  SOLN
100.0000 mL | Freq: Once | INTRAMUSCULAR | Status: AC | PRN
  Administered 2020-07-15: 100 mL via INTRAVENOUS

## 2020-07-15 MED ORDER — LACTATED RINGERS IV BOLUS
1000.0000 mL | Freq: Once | INTRAVENOUS | Status: AC
  Administered 2020-07-15: 1000 mL via INTRAVENOUS

## 2020-07-15 MED ORDER — ACETAMINOPHEN 500 MG PO TABS
1000.0000 mg | ORAL_TABLET | Freq: Once | ORAL | Status: AC
  Administered 2020-07-15: 1000 mg via ORAL
  Filled 2020-07-15: qty 2
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
## 2020-07-15 NOTE — Progress Notes (Signed)
To CT for scan. 

## 2020-07-15 NOTE — ED Notes (Signed)
X-ray at bedside

## 2020-07-15 NOTE — ED Notes (Signed)
No Ct room available at this time, will call this RN when room is open for CT

## 2020-07-15 NOTE — Progress Notes (Signed)
To MAU for continuous EFM.
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
## 2020-07-15 NOTE — Progress Notes (Addendum)
Back from CT.  Monitors applied for continuous EFM.  Notified MAU of pending transfer after being cleared by ER MD.
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
## 2020-07-16 ENCOUNTER — Encounter (HOSPITAL_COMMUNITY): Payer: Self-pay | Admitting: Obstetrics and Gynecology

## 2020-07-16 ENCOUNTER — Telehealth: Payer: Self-pay | Admitting: Student

## 2020-07-16 ENCOUNTER — Encounter: Payer: Self-pay | Admitting: Student

## 2020-07-16 DIAGNOSIS — U071 COVID-19: Secondary | ICD-10-CM

## 2020-07-16 DIAGNOSIS — O98513 Other viral diseases complicating pregnancy, third trimester: Secondary | ICD-10-CM | POA: Diagnosis not present

## 2020-07-16 HISTORY — DX: COVID-19: U07.1

## 2020-07-16 LAB — SARS CORONAVIRUS 2 (TAT 6-24 HRS): SARS Coronavirus 2: POSITIVE — AB

## 2020-07-16 NOTE — Telephone Encounter (Signed)
Called patient to notify her of COVID-19 positive results. Patient reports that she feels much better; she does not have any difficulty breathing. She feels active fetal movements. Encouraged patient to return to MAU if she starts feeling like she is short of breath, breathing fast. Also patient needs to call [facility] and tell them that she is COVID-19 positive and discuss plans for her ob visit on Monday. Recommended that patient's partner isolate at home until Sunday and then get vaccinated ASAP.  All questions answered.   Rhonda Massey

## 2020-08-12 ENCOUNTER — Encounter (HOSPITAL_COMMUNITY): Payer: Self-pay | Admitting: *Deleted

## 2020-08-12 ENCOUNTER — Telehealth (HOSPITAL_COMMUNITY): Payer: Self-pay | Admitting: *Deleted

## 2020-08-12 NOTE — Telephone Encounter (Signed)
Preadmission screen  

## 2020-08-17 ENCOUNTER — Telehealth (HOSPITAL_COMMUNITY): Payer: Self-pay | Admitting: *Deleted

## 2020-08-17 NOTE — Telephone Encounter (Signed)
Preadmission screen  

## 2020-08-20 ENCOUNTER — Inpatient Hospital Stay (HOSPITAL_COMMUNITY)
Admission: AD | Admit: 2020-08-20 | Discharge: 2020-08-23 | DRG: 788 | Disposition: A | Discharge status: Home / Self Care | Payer: 59 | Attending: Obstetrics & Gynecology | Admitting: Obstetrics & Gynecology

## 2020-08-20 ENCOUNTER — Inpatient Hospital Stay (HOSPITAL_COMMUNITY): Payer: 59 | Admitting: Anesthesiology

## 2020-08-20 ENCOUNTER — Encounter (HOSPITAL_COMMUNITY): Payer: Self-pay | Admitting: Obstetrics & Gynecology

## 2020-08-20 ENCOUNTER — Inpatient Hospital Stay (HOSPITAL_COMMUNITY): Payer: 59

## 2020-08-20 ENCOUNTER — Other Ambulatory Visit: Payer: Self-pay

## 2020-08-20 DIAGNOSIS — O99214 Obesity complicating childbirth: Secondary | ICD-10-CM | POA: Diagnosis present

## 2020-08-20 DIAGNOSIS — Z3A39 39 weeks gestation of pregnancy: Secondary | ICD-10-CM

## 2020-08-20 DIAGNOSIS — Z98891 History of uterine scar from previous surgery: Secondary | ICD-10-CM | POA: Diagnosis present

## 2020-08-20 DIAGNOSIS — E669 Obesity, unspecified: Secondary | ICD-10-CM | POA: Diagnosis present

## 2020-08-20 DIAGNOSIS — O99824 Streptococcus B carrier state complicating childbirth: Secondary | ICD-10-CM | POA: Diagnosis present

## 2020-08-20 DIAGNOSIS — Z3490 Encounter for supervision of normal pregnancy, unspecified, unspecified trimester: Secondary | ICD-10-CM

## 2020-08-20 DIAGNOSIS — O26893 Other specified pregnancy related conditions, third trimester: Secondary | ICD-10-CM | POA: Diagnosis present

## 2020-08-20 LAB — CBC
HCT: 34.5 % — ABNORMAL LOW (ref 36.0–46.0)
Hemoglobin: 12.3 g/dL (ref 12.0–15.0)
MCH: 30.3 pg (ref 26.0–34.0)
MCHC: 35.7 g/dL (ref 30.0–36.0)
MCV: 85 fL (ref 80.0–100.0)
Platelets: 314 10*3/uL (ref 150–400)
RBC: 4.06 MIL/uL (ref 3.87–5.11)
RDW: 12.4 % (ref 11.5–15.5)
WBC: 12.5 10*3/uL — ABNORMAL HIGH (ref 4.0–10.5)
nRBC: 0 % (ref 0.0–0.2)

## 2020-08-20 LAB — TYPE AND SCREEN
ABO/RH(D): A POS
Antibody Screen: NEGATIVE

## 2020-08-20 LAB — RPR: RPR Ser Ql: NONREACTIVE

## 2020-08-20 MED ORDER — SOD CITRATE-CITRIC ACID 500-334 MG/5ML PO SOLN
30.0000 mL | ORAL | Status: DC | PRN
  Filled 2020-08-20: qty 15

## 2020-08-20 MED ORDER — MISOPROSTOL 25 MCG QUARTER TABLET
25.0000 ug | ORAL_TABLET | ORAL | Status: DC | PRN
  Administered 2020-08-20 (×2): 25 ug via VAGINAL
  Filled 2020-08-20 (×2): qty 1

## 2020-08-20 MED ORDER — OXYCODONE-ACETAMINOPHEN 5-325 MG PO TABS: 2.0000 | ORAL_TABLET | ORAL | Status: DC | PRN

## 2020-08-20 MED ORDER — FENTANYL-BUPIVACAINE-NACL 0.5-0.125-0.9 MG/250ML-% EP SOLN
12.0000 mL/h | EPIDURAL | Status: DC | PRN
  Administered 2020-08-21: 12 mL/h via EPIDURAL
  Filled 2020-08-20 (×2): qty 250

## 2020-08-20 MED ORDER — OXYTOCIN-SODIUM CHLORIDE 30-0.9 UT/500ML-% IV SOLN
1.0000 m[IU]/min | INTRAVENOUS | Status: DC
  Administered 2020-08-20: 2 m[IU]/min via INTRAVENOUS

## 2020-08-20 MED ORDER — EPHEDRINE 5 MG/ML INJ: 10.0000 mg | INTRAVENOUS | Status: DC | PRN

## 2020-08-20 MED ORDER — LACTATED RINGERS IV SOLN
500.0000 mL | INTRAVENOUS | Status: DC | PRN
Start: 2020-08-20 — End: 2020-08-21
  Administered 2020-08-20: 500 mL via INTRAVENOUS

## 2020-08-20 MED ORDER — FENTANYL CITRATE (PF) 100 MCG/2ML IJ SOLN: 50.0000 ug | INTRAMUSCULAR | Status: DC | PRN

## 2020-08-20 MED ORDER — DIPHENHYDRAMINE HCL 50 MG/ML IJ SOLN: 12.5000 mg | INTRAMUSCULAR | Status: DC | PRN

## 2020-08-20 MED ORDER — OXYTOCIN BOLUS FROM INFUSION: 333.0000 mL | Freq: Once | INTRAVENOUS | Status: DC

## 2020-08-20 MED ORDER — ACETAMINOPHEN 325 MG PO TABS: 650.0000 mg | ORAL_TABLET | ORAL | Status: DC | PRN

## 2020-08-20 MED ORDER — PHENYLEPHRINE 40 MCG/ML (10ML) SYRINGE FOR IV PUSH (FOR BLOOD PRESSURE SUPPORT): 80.0000 ug | PREFILLED_SYRINGE | INTRAVENOUS | Status: DC | PRN

## 2020-08-20 MED ORDER — TERBUTALINE SULFATE 1 MG/ML IJ SOLN
0.2500 mg | Freq: Once | INTRAMUSCULAR | Status: AC | PRN
  Administered 2020-08-20: 0.25 mg via SUBCUTANEOUS

## 2020-08-20 MED ORDER — LACTATED RINGERS IV SOLN
500.0000 mL | Freq: Once | INTRAVENOUS | Status: AC
  Administered 2020-08-20: 500 mL via INTRAVENOUS

## 2020-08-20 MED ORDER — FENTANYL-BUPIVACAINE-NACL 0.5-0.125-0.9 MG/250ML-% EP SOLN
EPIDURAL | Status: DC | PRN
  Administered 2020-08-20: 12 mL/h via EPIDURAL

## 2020-08-20 MED ORDER — OXYTOCIN-SODIUM CHLORIDE 30-0.9 UT/500ML-% IV SOLN
2.5000 [IU]/h | INTRAVENOUS | Status: DC
  Filled 2020-08-20: qty 500

## 2020-08-20 MED ORDER — SODIUM CHLORIDE 0.9 % IV SOLN
5.0000 10*6.[IU] | Freq: Once | INTRAVENOUS | Status: AC
  Administered 2020-08-20: 5 10*6.[IU] via INTRAVENOUS
  Filled 2020-08-20: qty 5

## 2020-08-20 MED ORDER — LACTATED RINGERS IV SOLN: INTRAVENOUS | Status: DC

## 2020-08-20 MED ORDER — TERBUTALINE SULFATE 1 MG/ML IJ SOLN
0.2500 mg | Freq: Once | INTRAMUSCULAR | Status: DC | PRN
  Filled 2020-08-20: qty 1

## 2020-08-20 MED ORDER — LIDOCAINE HCL (PF) 1 % IJ SOLN: 30.0000 mL | INTRAMUSCULAR | Status: DC | PRN

## 2020-08-20 MED ORDER — ZOLPIDEM TARTRATE 5 MG PO TABS: 5.0000 mg | ORAL_TABLET | Freq: Every evening | ORAL | Status: DC | PRN

## 2020-08-20 MED ORDER — PENICILLIN G POT IN DEXTROSE 60000 UNIT/ML IV SOLN
3.0000 10*6.[IU] | INTRAVENOUS | Status: DC
  Administered 2020-08-20 – 2020-08-21 (×6): 3 10*6.[IU] via INTRAVENOUS
  Filled 2020-08-20 (×7): qty 50

## 2020-08-20 MED ORDER — LIDOCAINE HCL (PF) 1 % IJ SOLN
INTRAMUSCULAR | Status: DC | PRN
  Administered 2020-08-20: 2 mL via EPIDURAL
  Administered 2020-08-20: 10 mL via EPIDURAL

## 2020-08-20 MED ORDER — OXYCODONE-ACETAMINOPHEN 5-325 MG PO TABS: 1.0000 | ORAL_TABLET | ORAL | Status: DC | PRN

## 2020-08-20 MED ORDER — ONDANSETRON HCL 4 MG/2ML IJ SOLN: 4.0000 mg | Freq: Four times a day (QID) | INTRAMUSCULAR | Status: DC | PRN
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
## 2020-08-20 NOTE — Progress Notes (Signed)
Rhonda Massey is a 20 y.o. G4P0030 at [redacted]w[redacted]d by ultrasound admitted for induction of labor due to Elective at term.  Subjective: No c/o.  Objective: BP 125/88   Pulse 82   Temp 98.2 F (36.8 C) (Oral)   Resp 16   Ht 5\' 1"  (1.549 m)   Wt 82.4 kg   SpO2 99%   BMI 34.31 kg/m  No intake/output data recorded. No intake/output data recorded.  FHT:  FHR: 160 bpm, variability: moderate,  accelerations:  Present,  decelerations:  Present variable UC:   Unable to monitor SVE:   Dilation: 4 Effacement (%): 50 Station: -3 Exam by:: Dr. [email or web address]  AROM, light meconium stained fluid.  IUPC placed  Labs: Lab Results  Component Value Date   WBC 12.5 (H) 08/20/2020   HGB 12.3 08/20/2020   HCT 34.5 (L) 08/20/2020   MCV 85.0 08/20/2020   PLT 314 08/20/2020    Assessment / Plan: Induction of labor due to elective at term,  progressing well on pitocin  Labor: Progressing normally Preeclampsia:  n/a Fetal Wellbeing:  Category II; decrease pitocin to 4 mU Pain Control:  Epidural I/D:  n/a Anticipated MOD:  NSVD  08/22/2020 08/20/2020, 5:16 PM

## 2020-08-20 NOTE — Anesthesia Preprocedure Evaluation (Addendum)
Anesthesia Evaluation  Patient identified by MRN, date of birth, ID band Patient awake    Reviewed: Allergy & Precautions, Patient's Chart, lab work & pertinent test results  Airway Mallampati: II  TM Distance: >3 FB Neck ROM: Full    Dental no notable dental hx.    Pulmonary  COVID 07/15/20   Pulmonary exam normal breath sounds clear to auscultation       Cardiovascular negative cardio ROS Normal cardiovascular exam Rhythm:Regular Rate:Normal     Neuro/Psych PSYCHIATRIC DISORDERS Depression negative neurological ROS     GI/Hepatic negative GI ROS, Neg liver ROS,   Endo/Other  Obesity BMI 34  Renal/GU negative Renal ROS  negative genitourinary   Musculoskeletal Levoscoliosis, herniated lumbar discs per pt   Abdominal (+) + obese,   Peds negative pediatric ROS (+)  Hematology negative hematology ROS (+)   Anesthesia Other Findings   Reproductive/Obstetrics (+) Pregnancy                            Anesthesia Physical Anesthesia Plan  ASA: II and emergent  Anesthesia Plan: Epidural   Post-op Pain Management:    Induction:   PONV Risk Score and Plan: 2  Airway Management Planned: Natural Airway  Additional Equipment: None  Intra-op Plan:   Post-operative Plan:   Informed Consent: I have reviewed the patients History and Physical, chart, labs and discussed the procedure including the risks, benefits and alternatives for the proposed anesthesia with the patient or authorized representative who has indicated his/her understanding and acceptance.       Plan Discussed with:   Anesthesia Plan Comments: (Per pt, she was evaluated years ago by orthopedics for LBP and found to have some "pinched discs" and was told by orthopedist she would not be able to have a labor epidural if she ever got pregnant; pt unsure of physician name, unable to find records. Reviewed lumbar xray from 2015  which demonstrates some levoscoliosis. CT scan from 2018 states "mild levoconvex lumbar scoliosis." Explained to patient that the block patient may be more difficult than usual and there is some risk of patchy or one-sided block but we would still attempt to place epidural.)       Anesthesia Quick Evaluation

## 2020-08-20 NOTE — Progress Notes (Signed)
Rhonda Massey is a 20 y.o. G4P0030 at [redacted]w[redacted]d by ultrasound admitted for induction of labor due to Elective at term.  Subjective: Comfortable with CLEA.    Objective: BP 123/76   Pulse 94   Temp 98.2 F (36.8 C) (Oral)   Resp 16   Ht 5\' 1"  (1.549 m)   Wt 82.4 kg   SpO2 100%   BMI 34.31 kg/m  No intake/output data recorded. No intake/output data recorded.  FHT:  FHR: 145 bpm, variability: moderate,  accelerations:  Present,  decelerations:  Absent UC:   regular, every 2 minutes SVE:   FB out.  SVE 4/50/-3  Labs: Lab Results  Component Value Date   WBC 12.5 (H) 08/20/2020   HGB 12.3 08/20/2020   HCT 34.5 (L) 08/20/2020   MCV 85.0 08/20/2020   PLT 314 08/20/2020    Assessment / Plan: Induction of labor due to elective at term,  progressing well on pitocin  Labor: Progressing on Pitocin, will continue to increase then AROM Preeclampsia:  n/a Fetal Wellbeing:  Category I Pain Control:  Epidural I/D:  n/a Anticipated MOD:  NSVD  08/22/2020 08/20/2020, 2:04 PM

## 2020-08-20 NOTE — Progress Notes (Signed)
SVE 1.5/25/-3, FB placed with 60cc NS Will start pitocin Cat I tracing  Mitchel Honour, DO

## 2020-08-20 NOTE — Anesthesia Procedure Notes (Signed)
Epidural Patient location during procedure: OB Start time: 08/20/2020 11:01 AM End time: 08/20/2020 11:11 AM  Staffing Anesthesiologist: Lannie Fields, DO Performed: anesthesiologist   Preanesthetic Checklist Completed: patient identified, IV checked, risks and benefits discussed, monitors and equipment checked, pre-op evaluation and timeout performed  Epidural Patient position: sitting Prep: DuraPrep and site prepped and draped Patient monitoring: continuous pulse ox, blood pressure, heart rate and cardiac monitor Approach: midline Location: L3-L4 Injection technique: LOR air  Needle:  Needle type: Tuohy  Needle gauge: 17 G Needle length: 9 cm Needle insertion depth: 8 cm Catheter type: closed end flexible Catheter size: 19 Gauge Catheter at skin depth: 13 cm Test dose: negative  Assessment Sensory level: T8 Events: blood not aspirated, injection not painful, no injection resistance, no paresthesia and negative IV test  Additional Notes Patient identified. Risks/Benefits/Options discussed with patient including but not limited to bleeding, infection, nerve damage, paralysis, failed block, incomplete pain control, headache, blood pressure changes, nausea, vomiting, reactions to medication both or allergic, itching and postpartum back pain. Confirmed with bedside nurse the patient's most recent platelet count. Confirmed with patient that they are not currently taking any anticoagulation, have any bleeding history or any family history of bleeding disorders. Patient expressed understanding and wished to proceed. All questions were answered. Sterile technique was used throughout the entire procedure. Please see nursing notes for vital signs. Test dose was given through epidural catheter and negative prior to continuing to dose epidural or start infusion. Warning signs of high block given to the patient including shortness of breath, tingling/numbness in hands, complete motor  block, or any concerning symptoms with instructions to call for help. Patient was given instructions on fall risk and not to get out of bed. All questions and concerns addressed with instructions to call with any issues or inadequate analgesia.  Reason for block:procedure for pain

## 2020-08-21 ENCOUNTER — Encounter (HOSPITAL_COMMUNITY): Payer: Self-pay | Admitting: Obstetrics & Gynecology

## 2020-08-21 ENCOUNTER — Encounter (HOSPITAL_COMMUNITY)
Admission: AD | Disposition: A | Discharge status: Home / Self Care | Payer: Self-pay | Source: Home / Self Care | Attending: Obstetrics & Gynecology

## 2020-08-21 DIAGNOSIS — Z98891 History of uterine scar from previous surgery: Secondary | ICD-10-CM

## 2020-08-21 HISTORY — DX: History of uterine scar from previous surgery: Z98.891

## 2020-08-21 LAB — CBC
HCT: 31.8 % — ABNORMAL LOW (ref 36.0–46.0)
Hemoglobin: 10.6 g/dL — ABNORMAL LOW (ref 12.0–15.0)
MCH: 29 pg (ref 26.0–34.0)
MCHC: 33.3 g/dL (ref 30.0–36.0)
MCV: 87.1 fL (ref 80.0–100.0)
Platelets: 222 10*3/uL (ref 150–400)
RBC: 3.65 MIL/uL — ABNORMAL LOW (ref 3.87–5.11)
RDW: 12.3 % (ref 11.5–15.5)
WBC: 18.2 10*3/uL — ABNORMAL HIGH (ref 4.0–10.5)
nRBC: 0 % (ref 0.0–0.2)

## 2020-08-21 SURGERY — Surgical Case
Anesthesia: Epidural | Wound class: Clean Contaminated

## 2020-08-21 MED ORDER — MORPHINE SULFATE (PF) 0.5 MG/ML IJ SOLN
INTRAMUSCULAR | Status: AC
  Filled 2020-08-21: qty 10

## 2020-08-21 MED ORDER — ONDANSETRON HCL 4 MG/2ML IJ SOLN
INTRAMUSCULAR | Status: DC | PRN
  Administered 2020-08-21: 4 mg via INTRAVENOUS

## 2020-08-21 MED ORDER — SODIUM CHLORIDE 0.9 % IV SOLN
500.0000 mg | Freq: Once | INTRAVENOUS | Status: AC
  Administered 2020-08-21: 500 mg via INTRAVENOUS

## 2020-08-21 MED ORDER — SIMETHICONE 80 MG PO CHEW: 80.0000 mg | CHEWABLE_TABLET | ORAL | Status: DC | PRN

## 2020-08-21 MED ORDER — NALBUPHINE HCL 10 MG/ML IJ SOLN: 5.0000 mg | INTRAMUSCULAR | Status: DC | PRN

## 2020-08-21 MED ORDER — KETOROLAC TROMETHAMINE 30 MG/ML IJ SOLN: 30.0000 mg | Freq: Four times a day (QID) | INTRAMUSCULAR | Status: AC | PRN

## 2020-08-21 MED ORDER — TETANUS-DIPHTH-ACELL PERTUSSIS 5-2.5-18.5 LF-MCG/0.5 IM SUSY: 0.5000 mL | PREFILLED_SYRINGE | Freq: Once | INTRAMUSCULAR | Status: DC

## 2020-08-21 MED ORDER — SODIUM CHLORIDE 0.9 % IV SOLN
INTRAVENOUS | Status: AC
  Filled 2020-08-21: qty 500

## 2020-08-21 MED ORDER — DEXAMETHASONE SODIUM PHOSPHATE 4 MG/ML IJ SOLN
INTRAMUSCULAR | Status: AC
  Filled 2020-08-21: qty 1

## 2020-08-21 MED ORDER — PROMETHAZINE HCL 25 MG/ML IJ SOLN
6.2500 mg | INTRAMUSCULAR | Status: DC | PRN
Start: 2020-08-21 — End: 2020-08-21

## 2020-08-21 MED ORDER — DEXAMETHASONE SODIUM PHOSPHATE 4 MG/ML IJ SOLN
INTRAMUSCULAR | Status: DC | PRN
  Administered 2020-08-21: 4 mg via INTRAVENOUS

## 2020-08-21 MED ORDER — SODIUM CHLORIDE 0.9 % IV SOLN: INTRAVENOUS | Status: DC | PRN

## 2020-08-21 MED ORDER — MORPHINE SULFATE (PF) 0.5 MG/ML IJ SOLN
INTRAMUSCULAR | Status: DC | PRN
  Administered 2020-08-21: 3 mg via EPIDURAL

## 2020-08-21 MED ORDER — SIMETHICONE 80 MG PO CHEW
80.0000 mg | CHEWABLE_TABLET | Freq: Three times a day (TID) | ORAL | Status: DC
  Administered 2020-08-21 – 2020-08-23 (×4): 80 mg via ORAL
  Filled 2020-08-21 (×5): qty 1

## 2020-08-21 MED ORDER — MEPERIDINE HCL 25 MG/ML IJ SOLN: 6.2500 mg | INTRAMUSCULAR | Status: DC | PRN

## 2020-08-21 MED ORDER — SODIUM CHLORIDE 0.9% FLUSH: 3.0000 mL | INTRAVENOUS | Status: DC | PRN

## 2020-08-21 MED ORDER — ZOLPIDEM TARTRATE 5 MG PO TABS: 5.0000 mg | ORAL_TABLET | Freq: Every evening | ORAL | Status: DC | PRN

## 2020-08-21 MED ORDER — HYDROMORPHONE HCL 1 MG/ML IJ SOLN: 0.2500 mg | INTRAMUSCULAR | Status: DC | PRN

## 2020-08-21 MED ORDER — OXYTOCIN-SODIUM CHLORIDE 30-0.9 UT/500ML-% IV SOLN
INTRAVENOUS | Status: DC | PRN
  Administered 2020-08-21: 30 [IU] via INTRAVENOUS

## 2020-08-21 MED ORDER — OXYTOCIN-SODIUM CHLORIDE 30-0.9 UT/500ML-% IV SOLN
2.5000 [IU]/h | INTRAVENOUS | Status: AC
  Administered 2020-08-21: 2.5 [IU]/h via INTRAVENOUS
  Filled 2020-08-21: qty 500

## 2020-08-21 MED ORDER — ACETAMINOPHEN 325 MG PO TABS
650.0000 mg | ORAL_TABLET | ORAL | Status: DC | PRN
  Administered 2020-08-22 – 2020-08-23 (×5): 650 mg via ORAL
  Filled 2020-08-21 (×5): qty 2

## 2020-08-21 MED ORDER — FENTANYL CITRATE (PF) 100 MCG/2ML IJ SOLN
INTRAMUSCULAR | Status: AC
  Filled 2020-08-21: qty 2

## 2020-08-21 MED ORDER — DIPHENHYDRAMINE HCL 25 MG PO CAPS: 25.0000 mg | ORAL_CAPSULE | ORAL | Status: DC | PRN

## 2020-08-21 MED ORDER — PHENYLEPHRINE HCL (PRESSORS) 10 MG/ML IV SOLN
INTRAVENOUS | Status: DC | PRN
  Administered 2020-08-21 (×3): 120 ug via INTRAVENOUS

## 2020-08-21 MED ORDER — KETOROLAC TROMETHAMINE 30 MG/ML IJ SOLN
30.0000 mg | Freq: Four times a day (QID) | INTRAMUSCULAR | Status: AC | PRN
  Administered 2020-08-21: 30 mg via INTRAMUSCULAR

## 2020-08-21 MED ORDER — ONDANSETRON HCL 4 MG/2ML IJ SOLN
INTRAMUSCULAR | Status: AC
  Filled 2020-08-21: qty 2

## 2020-08-21 MED ORDER — WITCH HAZEL-GLYCERIN EX PADS: 1.0000 "application " | MEDICATED_PAD | CUTANEOUS | Status: DC | PRN

## 2020-08-21 MED ORDER — SCOPOLAMINE 1 MG/3DAYS TD PT72
MEDICATED_PATCH | TRANSDERMAL | Status: DC | PRN
  Administered 2020-08-21: 1 via TRANSDERMAL

## 2020-08-21 MED ORDER — MENTHOL 3 MG MT LOZG: 1.0000 | LOZENGE | OROMUCOSAL | Status: DC | PRN

## 2020-08-21 MED ORDER — OXYTOCIN-SODIUM CHLORIDE 30-0.9 UT/500ML-% IV SOLN
INTRAVENOUS | Status: AC
  Filled 2020-08-21: qty 500

## 2020-08-21 MED ORDER — KETOROLAC TROMETHAMINE 30 MG/ML IJ SOLN: 30.0000 mg | Freq: Once | INTRAMUSCULAR | Status: DC | PRN

## 2020-08-21 MED ORDER — SODIUM CHLORIDE 0.9 % IR SOLN
Status: DC | PRN
  Administered 2020-08-21: 1000 mL

## 2020-08-21 MED ORDER — ACETAMINOPHEN 500 MG PO TABS
1000.0000 mg | ORAL_TABLET | Freq: Four times a day (QID) | ORAL | Status: AC
  Administered 2020-08-21 – 2020-08-22 (×3): 1000 mg via ORAL
  Filled 2020-08-21 (×3): qty 2

## 2020-08-21 MED ORDER — CEFAZOLIN SODIUM-DEXTROSE 2-4 GM/100ML-% IV SOLN
INTRAVENOUS | Status: AC
  Filled 2020-08-21: qty 100

## 2020-08-21 MED ORDER — PRENATAL MULTIVITAMIN CH
1.0000 | ORAL_TABLET | Freq: Every day | ORAL | Status: DC
  Administered 2020-08-21 – 2020-08-23 (×3): 1 via ORAL
  Filled 2020-08-21 (×3): qty 1

## 2020-08-21 MED ORDER — DIPHENHYDRAMINE HCL 25 MG PO CAPS: 25.0000 mg | ORAL_CAPSULE | Freq: Four times a day (QID) | ORAL | Status: DC | PRN

## 2020-08-21 MED ORDER — MEPERIDINE HCL 25 MG/ML IJ SOLN
INTRAMUSCULAR | Status: AC
  Filled 2020-08-21: qty 1

## 2020-08-21 MED ORDER — NALOXONE HCL 0.4 MG/ML IJ SOLN: 0.4000 mg | INTRAMUSCULAR | Status: DC | PRN

## 2020-08-21 MED ORDER — NALBUPHINE HCL 10 MG/ML IJ SOLN: 5.0000 mg | Freq: Once | INTRAMUSCULAR | Status: DC | PRN

## 2020-08-21 MED ORDER — COCONUT OIL OIL
1.0000 "application " | TOPICAL_OIL | Status: DC | PRN
  Administered 2020-08-21: 1 via TOPICAL

## 2020-08-21 MED ORDER — SCOPOLAMINE 1 MG/3DAYS TD PT72: 1.0000 | MEDICATED_PATCH | Freq: Once | TRANSDERMAL | Status: DC

## 2020-08-21 MED ORDER — SENNOSIDES-DOCUSATE SODIUM 8.6-50 MG PO TABS
2.0000 | ORAL_TABLET | Freq: Every day | ORAL | Status: DC
  Administered 2020-08-22 – 2020-08-23 (×2): 2 via ORAL
  Filled 2020-08-21 (×2): qty 2

## 2020-08-21 MED ORDER — SODIUM CHLORIDE 0.9 % IR SOLN
Status: DC | PRN
  Administered 2020-08-21: 700 mL

## 2020-08-21 MED ORDER — FENTANYL CITRATE (PF) 100 MCG/2ML IJ SOLN
INTRAMUSCULAR | Status: DC | PRN
  Administered 2020-08-21: 100 ug via INTRAVENOUS

## 2020-08-21 MED ORDER — NALOXONE HCL 4 MG/10ML IJ SOLN
1.0000 ug/kg/h | INTRAMUSCULAR | Status: DC | PRN
  Filled 2020-08-21: qty 5

## 2020-08-21 MED ORDER — MORPHINE SULFATE (PF) 0.5 MG/ML IJ SOLN
INTRAMUSCULAR | Status: DC | PRN
  Administered 2020-08-21: 1 mg via INTRAVENOUS

## 2020-08-21 MED ORDER — DIPHENHYDRAMINE HCL 50 MG/ML IJ SOLN: 12.5000 mg | INTRAMUSCULAR | Status: DC | PRN

## 2020-08-21 MED ORDER — CEFAZOLIN SODIUM-DEXTROSE 2-4 GM/100ML-% IV SOLN
2.0000 g | Freq: Once | INTRAVENOUS | Status: AC
  Administered 2020-08-21: 2 g via INTRAVENOUS

## 2020-08-21 MED ORDER — ONDANSETRON HCL 4 MG/2ML IJ SOLN: 4.0000 mg | Freq: Three times a day (TID) | INTRAMUSCULAR | Status: DC | PRN

## 2020-08-21 MED ORDER — MEPERIDINE HCL 25 MG/ML IJ SOLN
INTRAMUSCULAR | Status: DC | PRN
  Administered 2020-08-21 (×2): 12.5 mg via INTRAVENOUS

## 2020-08-21 MED ORDER — OXYCODONE HCL 5 MG PO TABS
5.0000 mg | ORAL_TABLET | ORAL | Status: DC | PRN
  Administered 2020-08-22: 5 mg via ORAL
  Administered 2020-08-22 (×2): 10 mg via ORAL
  Administered 2020-08-22 (×2): 5 mg via ORAL
  Administered 2020-08-23 (×2): 10 mg via ORAL
  Filled 2020-08-21: qty 2
  Filled 2020-08-21: qty 1
  Filled 2020-08-21: qty 2
  Filled 2020-08-21: qty 1
  Filled 2020-08-21 (×2): qty 2
  Filled 2020-08-21: qty 1

## 2020-08-21 MED ORDER — LACTATED RINGERS IV SOLN: INTRAVENOUS | Status: DC | PRN

## 2020-08-21 MED ORDER — KETOROLAC TROMETHAMINE 30 MG/ML IJ SOLN
INTRAMUSCULAR | Status: AC
  Filled 2020-08-21: qty 1

## 2020-08-21 MED ORDER — DIBUCAINE (PERIANAL) 1 % EX OINT: 1.0000 "application " | TOPICAL_OINTMENT | CUTANEOUS | Status: DC | PRN

## 2020-08-21 MED ORDER — LACTATED RINGERS IV SOLN: INTRAVENOUS | Status: DC

## 2020-08-21 MED ORDER — SODIUM CHLORIDE (PF) 0.9 % IJ SOLN
INTRAMUSCULAR | Status: AC
  Filled 2020-08-21: qty 10

## 2020-08-21 MED ORDER — LIDOCAINE-EPINEPHRINE (PF) 2 %-1:200000 IJ SOLN
INTRAMUSCULAR | Status: DC | PRN
  Administered 2020-08-21 (×3): 5 mL via INTRADERMAL

## 2020-08-21 SURGICAL SUPPLY — 33 items
BENZOIN TINCTURE PRP APPL 2/3 (GAUZE/BANDAGES/DRESSINGS) ×2 IMPLANT
CHLORAPREP W/TINT 26ML (MISCELLANEOUS) ×2 IMPLANT
CLAMP CORD UMBIL (MISCELLANEOUS) IMPLANT
CLOTH BEACON ORANGE TIMEOUT ST (SAFETY) ×2 IMPLANT
CLSR STERI-STRIP ANTIMIC 1/2X4 (GAUZE/BANDAGES/DRESSINGS) ×2 IMPLANT
DERMABOND ADVANCED (GAUZE/BANDAGES/DRESSINGS)
DERMABOND ADVANCED .7 DNX12 (GAUZE/BANDAGES/DRESSINGS) IMPLANT
DRSG OPSITE POSTOP 4X10 (GAUZE/BANDAGES/DRESSINGS) ×2 IMPLANT
ELECT REM PT RETURN 9FT ADLT (ELECTROSURGICAL) ×2
ELECTRODE REM PT RTRN 9FT ADLT (ELECTROSURGICAL) ×1 IMPLANT
EXTRACTOR VACUUM KIWI (MISCELLANEOUS) IMPLANT
GLOVE BIO SURGEON STRL SZ 6 (GLOVE) ×2 IMPLANT
GLOVE BIOGEL PI IND STRL 6 (GLOVE) ×2 IMPLANT
GLOVE BIOGEL PI IND STRL 7.0 (GLOVE) ×1 IMPLANT
GLOVE BIOGEL PI INDICATOR 6 (GLOVE) ×2
GLOVE BIOGEL PI INDICATOR 7.0 (GLOVE) ×1
GOWN STRL REUS W/TWL LRG LVL3 (GOWN DISPOSABLE) ×4 IMPLANT
KIT ABG SYR 3ML LUER SLIP (SYRINGE) ×2 IMPLANT
NEEDLE HYPO 25X5/8 SAFETYGLIDE (NEEDLE) ×2 IMPLANT
NS IRRIG 1000ML POUR BTL (IV SOLUTION) ×2 IMPLANT
PACK C SECTION WH (CUSTOM PROCEDURE TRAY) ×2 IMPLANT
PAD OB MATERNITY 4.3X12.25 (PERSONAL CARE ITEMS) ×2 IMPLANT
PENCIL SMOKE EVAC W/HOLSTER (ELECTROSURGICAL) ×2 IMPLANT
STRIP CLOSURE SKIN 1/2X4 (GAUZE/BANDAGES/DRESSINGS) IMPLANT
SUT CHROMIC 0 CTX 36 (SUTURE) ×6 IMPLANT
SUT MON AB 2-0 CT1 27 (SUTURE) ×2 IMPLANT
SUT PDS AB 0 CT1 27 (SUTURE) IMPLANT
SUT PLAIN 0 NONE (SUTURE) IMPLANT
SUT VIC AB 0 CT1 36 (SUTURE) IMPLANT
SUT VIC AB 4-0 KS 27 (SUTURE) IMPLANT
TOWEL OR 17X24 6PK STRL BLUE (TOWEL DISPOSABLE) ×2 IMPLANT
TRAY FOLEY W/BAG SLVR 14FR LF (SET/KITS/TRAYS/PACK) IMPLANT
WATER STERILE IRR 1000ML POUR (IV SOLUTION) ×2 IMPLANT

## 2020-08-21 NOTE — Transfer of Care (Signed)
Immediate Anesthesia Transfer of Care Note  Patient: Rhonda Massey  Procedure(s) Performed: CESAREAN SECTION (N/A )  Patient Location: PACU  Anesthesia Type:Epidural  Level of Consciousness: awake, alert  and oriented  Airway & Oxygen Therapy: Patient Spontanous Breathing  Post-op Assessment: Report given to RN and Post -op Vital signs reviewed and stable  Post vital signs: Reviewed and stable  Last Vitals:  Vitals Value Taken Time  BP 130/76 08/21/20 0434  Temp    Pulse 102 08/21/20 0441  Resp 20 08/21/20 0441  SpO2 98 % 08/21/20 0441  Vitals shown include unvalidated device data.  Last Pain:  Vitals:   08/21/20 0237  TempSrc: Oral  PainSc:       Patients Stated Pain Goal: 4 (08/20/20 1120)  Complications: No complications documented.

## 2020-08-21 NOTE — Progress Notes (Signed)
Patient has intermittent late deceleration which limits ability to increase pitocin above 4 mU.  Patient has been repositioned, fluid bolus given.  Late decelerations only temporarily improve.  Given remote from delivery (cvx 5/75/-1), I recommend C/S for non-reassuring fetal monitoring.  Patient is informed of risk of bleeding, infection, scarring, and damage to surrounding structures.  She is informed of risk of uterine rupture and abnormal placentation in future pregnancies.  All questions were answered and patient wishes to proceed.  Mitchel Honour, DO

## 2020-08-21 NOTE — Lactation Note (Signed)
This note was copied from a baby's chart. Lactation Consultation Note  Patient Name: Rhonda Massey [id number] Date: 08/21/2020 Reason for consult: Initial assessment Age:20 years   Mother is a P75,  Mother was given [facility] brochure and basic teaching done.  Mother reports that infant is feeding well.  Reviewed hand expression with mother. Observed large drops of colostrum.   Mother had infant latched when I arrived . Infant sustained latch for 15 mins. When infant released the breast , observed that the nipple had a ridge around the nipple. Mother has a blood blister on the rt nipple.  Encouraged mother to use firm support and bring infant close to the  breast. Assist mother with latching infant on the alternate breast with good pillow support. Lots of teaching with mother .   Mother to continue to cue base feed infant and feed at least 8-12 times or more in 24 hours and advised to allow for cluster feeding infant as needed.  Mother to continue to due STS. Mother is aware of available LC services at [facility], BFSG'S, OP Dept, and phone # for questions or concerns about breastfeeding.  Mother receptive to all teaching and plan of care.    Maternal Data Has patient been taught Hand Expression?: Yes  Feeding Mother's Current Feeding Choice: Breast Milk  LATCH Score Latch: Grasps breast easily, tongue down, lips flanged, rhythmical sucking.  Audible Swallowing: A few with stimulation  Type of Nipple: Everted at rest and after stimulation  Comfort (Breast/Nipple): Filling, red/small blisters or bruises, mild/mod discomfort  Hold (Positioning): Assistance needed to correctly position infant at breast and maintain latch.  LATCH Score: 7   Lactation Tools Discussed/Used    Interventions Interventions: Breast feeding basics reviewed;Assisted with latch;Skin to skin;Hand express;Breast compression;Adjust position;Support pillows;Position options;Education  Discharge    Consult  Status      Stevan Born [facility] 08/21/2020, 3:30 PM

## 2020-08-21 NOTE — Anesthesia Postprocedure Evaluation (Signed)
Anesthesia Post Note  Patient: Rhonda Massey  Procedure(s) Performed: CESAREAN SECTION (N/A )     Patient location during evaluation: PACU Anesthesia Type: Epidural Level of consciousness: awake and alert and oriented Pain management: pain level controlled Vital Signs Assessment: post-procedure vital signs reviewed and stable Respiratory status: spontaneous breathing, nonlabored ventilation and respiratory function stable Cardiovascular status: blood pressure returned to baseline and stable Postop Assessment: no headache, no backache, patient able to bend at knees and epidural receding Anesthetic complications: no   No complications documented.  Last Vitals:  Vitals:   08/21/20 0600 08/21/20 0615  BP: 115/74 124/75  Pulse: 71 72  Resp: 18 18  Temp:  37.9 C  SpO2: 96% 95%    Last Pain:  Vitals:   08/21/20 0615  TempSrc: Oral  PainSc: 0-No pain   Pain Goal: Patients Stated Pain Goal: 4 (08/20/20 1120)                 Lannie Fields

## 2020-08-21 NOTE — Progress Notes (Signed)
Subjective: Postpartum Day 0: Cesarean Delivery Patient reports tolerating PO.    Objective: Vital signs in last 24 hours: Temp:  [98.2 F (36.8 C)-100.2 F (37.9 C)] 100.2 F (37.9 C) (02/19 0615) Pulse Rate:  [71-138] 72 (02/19 0615) Resp:  [14-22] 18 (02/19 0615) BP: (99-141)/(57-94) 124/75 (02/19 0615) SpO2:  [95 %-100 %] 95 % (02/19 0615)  Physical Exam:  General: alert, cooperative and appears stated age Lochia: appropriate Uterine Fundus: firm Incision: healing well, no significant drainage, no dehiscence DVT Evaluation: No evidence of DVT seen on physical exam. Negative Homan's sign. No cords or calf tenderness.  Recent Labs    08/20/20 0057 08/21/20 0618  HGB 12.3 10.6*  HCT 34.5* 31.8*    Assessment/Plan: Status post Cesarean section. Doing well postoperatively.  Continue current care.  Mitchel Honour 08/21/2020, 7:04 AM

## 2020-08-21 NOTE — Op Note (Signed)
Jamal Thornell Mule PROCEDURE DATE: 08/20/2020 - 08/21/2020  PREOPERATIVE DIAGNOSIS: Intrauterine pregnancy at  [redacted]w[redacted]d weeks gestation, non-reassuring fetal monitoring  POSTOPERATIVE DIAGNOSIS: The same plus OP presentation  PROCEDURE:  Primary Low Transverse Cesarean Section  SURGEON:  Dr. Mitchel Honour  INDICATIONS: Rhonda Massey is a 20 y.o. G4P0030 at [redacted]w[redacted]d scheduled for cesarean section secondary to non-reassuring fetal monitoring.  The risks of cesarean section discussed with the patient included but were not limited to: bleeding which may require transfusion or reoperation; infection which may require antibiotics; injury to bowel, bladder, ureters or other surrounding organs; injury to the fetus; need for additional procedures including hysterectomy in the event of a life-threatening hemorrhage; placental abnormalities wth subsequent pregnancies, incisional problems, thromboembolic phenomenon and other postoperative/anesthesia complications. The patient concurred with the proposed plan, giving informed written consent for the procedure.    FINDINGS:  Viable female infant in cephalic presentation (OP), APGARs 8, 9:  weight pending.  Meconium stained amniotic fluid.  Intact placenta, three vessel cord.  Grossly normal uterus, ovaries and fallopian tubes. .   ANESTHESIA:    Epidural ESTIMATED BLOOD LOSS: 220 mL ml SPECIMENS: Placenta sent to pathology COMPLICATIONS: None immediate  PROCEDURE IN DETAIL:  The patient received intravenous antibiotics and had sequential compression devices applied to her lower extremities while in the preoperative area.  She was then taken to the operating room where epidural anesthesia was dosed up to surgical level and was found to be adequate. She was then placed in a dorsal supine position with a leftward tilt, and prepped and draped in a sterile manner.  A foley catheter was placed into her bladder and attached to constant gravity.  After an  adequate timeout was performed, a Pfannenstiel skin incision was made with scalpel and carried through to the underlying layer of fascia. The fascia was incised in the midline and this incision was extended bilaterally using the Mayo scissors. Kocher clamps were applied to the superior aspect of the fascial incision and the underlying rectus muscles were dissected off bluntly. A similar process was carried out on the inferior aspect of the facial incision. The rectus muscles were separated in the midline bluntly and the peritoneum was entered bluntly.  Bladder flap was created sharply and developed bluntly.  Bladder blade was placed.  A transverse hysterotomy was made with a scalpel and extended bilaterally bluntly. The bladder blade was then removed. The infant was successfully delivered, and cord was clamped and cut and infant was handed over to awaiting neonatology team. Uterine massage was then administered and the placenta delivered intact with three-vessel cord. The uterus was cleared of clot and debris.  The hysterotomy was closed with 0 chromic.  A second imbricating suture of 0-chromic was used to reinforce the incision and aid in hemostasis.  The peritoneum and rectus muscles were noted to be hemostatic and were reapproximated using 3-0 monocryl in a running fashion.  The fascia was closed with 0-Vicryl in a running fashion with good restoration of anatomy.  The subcutaneus tissue was copiously irrigated.  The skin was closed with 4-0 vicryl in a subcuticular fashion.  Pt tolerated the procedure will.  All counts were correct x2.  Pt went to the recovery room in stable condition.

## 2020-08-22 NOTE — Progress Notes (Signed)
Subjective: Postpartum Day 1: Cesarean Delivery Patient reports tolerating PO, + flatus and no problems voiding.    Objective: Vital signs in last 24 hours: Temp:  [98.1 F (36.7 C)-99.3 F (37.4 C)] 99.3 F (37.4 C) (02/20 0430) Pulse Rate:  [67-98] 98 (02/20 0430) Resp:  [16-18] 16 (02/20 0430) BP: (106-126)/(60-90) 126/90 (02/20 0430) SpO2:  [97 %-98 %] 98 % (02/20 0430)  Physical Exam:  General: alert, cooperative and appears stated age Lochia: appropriate Uterine Fundus: firm Incision: healing well, no significant drainage DVT Evaluation: No evidence of DVT seen on physical exam. Negative Homan's sign. No cords or calf tenderness.  Recent Labs    08/20/20 0057 08/21/20 0618  HGB 12.3 10.6*  HCT 34.5* 31.8*    Assessment/Plan: Status post Cesarean section. Doing well postoperatively.  Continue current care.  Mitchel Honour 08/22/2020, 8:44 AM
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
## 2020-08-22 NOTE — Lactation Note (Signed)
This note was copied from a baby's chart. Lactation Consultation Note  Patient Name: Rhonda Massey [id number] Date: 08/22/2020 Reason for consult: Follow-up assessment;Term;1st time breastfeeding;Primapara;Infant weight loss Age:22 hours  Visited with mom of 41 hours old FT female, she's a P1 and reported (+) breast changes during the pregnancy. Offered assistance with latch and mom politely declined stating that baby just fed prior LC entering the room.  As LC was reviewing feeding cues, cluster feeding, size of baby's stomach and BF basics baby started cueing and mom decided to take her to breast. However, it was hard to see because the room was really dark and parents would not turn the light on, they're afraid that baby would not go back to sleep (mom reported that happened last night, informed them that baby was most likely cluster feeding).   Family will need latch assistance during daylight hours prior discharge tomorrow because baby wasn't as deep at the breast and mom kept switching to typical cradle hold and kept covering baby with blankets. Mother reported feedings at the breast have gone better, unable to assess blister/bleeding due to lack of light (asked parents to turn on the lights twice, but they didn't respond).  Feeding plan:  1. Encouraged mom to feed baby STS 8-12 times/24 hours or sooner if feeding cues are present 2. Hand expression and breast massage were also encouraged prior feedings  FOB present at the time of [facility] consultation. Parents reported all questions and concerns were answered, they're both aware of LC OP services and will call PRN.   Maternal Data    Feeding Mother's Current Feeding Choice: Breast Milk  LATCH Score Latch: Repeated attempts needed to sustain latch, nipple held in mouth throughout feeding, stimulation needed to elicit sucking reflex.  Audible Swallowing: A few with stimulation  Type of Nipple: Everted at rest and after  stimulation  Comfort (Breast/Nipple): Soft / non-tender  Hold (Positioning): Assistance needed to correctly position infant at breast and maintain latch.  LATCH Score: 7   Lactation Tools Discussed/Used    Interventions Interventions: Breast feeding basics reviewed;Assisted with latch;Skin to skin;Hand express;Support pillows;Adjust position  Discharge    Consult Status Consult Status: Follow-up Date: 08/23/20 Follow-up type: In-patient    Rhonda Massey 08/22/2020, 9:27 PM

## 2020-08-23 MED ORDER — OXYCODONE HCL 5 MG PO TABS: 5.0000 mg | ORAL_TABLET | Freq: Four times a day (QID) | ORAL | 0 refills | Status: DC | PRN

## 2020-08-23 MED ORDER — ACETAMINOPHEN 500 MG PO TABS: 1000.0000 mg | ORAL_TABLET | Freq: Three times a day (TID) | ORAL | 0 refills | Status: DC | PRN

## 2020-08-23 NOTE — Discharge Summary (Signed)
Postpartum Discharge Summary  Date of Service updated 08/23/20     Patient Name: Rhonda Massey DOB: 11/10/2000 MRN: [id number]  Date of admission: 08/20/2020 Delivery date:08/21/2020  Delivering provider: Linda Hedges  Date of discharge: 08/23/2020  Admitting diagnosis: Pregnancy [Z34.90] Cesarean delivery delivered [O82] Intrauterine pregnancy: [redacted]w[redacted]d    Secondary diagnosis:  Active Problems:   Pregnancy   Cesarean delivery delivered  Additional problems:     Discharge diagnosis: Term Pregnancy Delivered                                              Post partum procedures: Augmentation: AROM and Pitocin Complications: None  Hospital course: Induction of Labor With Cesarean Section   20y.o. yo [phone] [id number] admitted to the hospital 08/20/2020 for induction of labor. Patient had a labor course significant for late fetal decelerations. The patient went for cesarean section due to Non-Reassuring FHR. Delivery details are as follows: Membrane Rupture Time/Date: 5:15 PM ,08/20/2020   Delivery Method:C-Section, Low Transverse  Details of operation can be found in separate operative Note.  Patient had an uncomplicated postpartum course. She is ambulating, tolerating a regular diet, passing flatus, and urinating well.  Patient is discharged home in stable condition on 08/23/20.      Newborn Data: Birth date:08/21/2020  Birth time:3:44 AM  Gender:Female  Living status:Living  Apgars:8 ,9  Weight:2906 g                                 Magnesium Sulfate received: No BMZ received: No Rhophylac:No MMR:No T-DaP:Given prenatally Flu: No Transfusion:No  Physical exam  Vitals:   08/22/20 0430 08/22/20 1554 08/22/20 2300 08/23/20 0516  BP: 126/90 132/78 125/79 101/62  Pulse: 98 (!) 114 (!) 105 86  Resp: 16 20 20 16   Temp: 99.3 F (37.4 C) 99.2 F (37.3 C) 98.6 F (37 C) 98.1 F (36.7 C)  TempSrc: Oral Oral Oral Oral  SpO2: 98% 97%  98%  Weight:       Height:       General: alert, cooperative and no distress Lochia: appropriate Uterine Fundus: firm Incision: Healing well with no significant drainage DVT Evaluation: No evidence of DVT seen on physical exam. Labs: Lab Results  Component Value Date   WBC 18.2 (H) 08/21/2020   HGB 10.6 (L) 08/21/2020   HCT 31.8 (L) 08/21/2020   MCV 87.1 08/21/2020   PLT 222 08/21/2020   CMP Latest Ref Rng & Units 07/15/2020  Glucose 70 - 99 mg/dL 101(H)  BUN 6 - 20 mg/dL 8  Creatinine 0.44 - 1.00 mg/dL 0.30(L)  Sodium 135 - 145 mmol/L 137  Potassium 3.5 - 5.1 mmol/L 4.5  Chloride 98 - 111 mmol/L 107  CO2 22 - 32 mmol/L -  Calcium 8.9 - 10.3 mg/dL -  Total Protein 6.5 - 8.1 g/dL -  Total Bilirubin 0.3 - 1.2 mg/dL -  Alkaline Phos 38 - 126 U/L -  AST 15 - 41 U/L -  ALT 0 - 44 U/L -   Edinburgh Score: Edinburgh Postnatal Depression Scale Screening Tool 08/21/2020  I have been able to laugh and see the funny side of things. (No Data)      After visit meds:     Discharge home in stable  condition Infant Feeding: Breast Infant Disposition:home with mother Discharge instruction: per After Visit Summary and Postpartum booklet. Activity: Advance as tolerated. Pelvic rest for 6 weeks.  Diet: routine diet Anticipated Birth Control: Unsure Postpartum Appointment:6 weeks Additional Postpartum F/U:  Future Appointments:No future appointments. Follow up Visit:      08/23/2020 Allena Katz, MD

## 2020-08-23 NOTE — Lactation Note (Signed)
This note was copied from a baby's chart. Lactation Consultation Note  Patient Name: Rhonda Massey [id number] Date: 08/23/2020 Reason for consult: Follow-up assessment Age:22 hours   P1 mother whose infant is now 34 hours old.  This is a term baby at 39+1 weeks with an 8% weight loss this morning.  Baby was finishing a feeding when I arrived.  Baby was dressed with a blanket to cover and had a shallow latch.  Discussed breast feeding basics with mother.  Since she was almost asleep at the breast I suggested mother call me back for the next feeding.  Informed her that I would like to assess breast feeding to be certain that mother/baby can latch/feed together.  Mother interested and will call for my assistance.  Discussed engorgement prevention/treatment and provided a manual pump with instructions for use.  Mother has a DEBP for home use.  RN updated.  Will inform MD that I will return for more teaching and a feeding assessment.   Maternal Data    Feeding    LATCH Score                    Lactation Tools Discussed/Used    Interventions    Discharge    Consult Status Consult Status: Follow-up Date: 08/23/20 Follow-up type: In-patient    Dora Sims 08/23/2020, 10:39 AM

## 2020-08-23 NOTE — Lactation Note (Signed)
This note was copied from a baby's chart. Lactation Consultation Note  Patient Name: Rhonda Massey [id number] Date: 08/23/2020 Reason for consult: Follow-up assessment Age:22 hours   LC Follow Up Visit:  Mother call for latch assistance as discussed from my earlier visit.  Mother had baby STS and no blanket cover when I arrived.  Observed mother latching in the football hold on the left breast.  Guided mother's hand to place baby deep into the breast tissue.  Showed mother proper hand/finger placement and provided a blanket roll for good support.  Baby much more awake than earlier and began actively sucking at the breast.  Demonstrated gentle stimulation to keep baby engaged with her feeding.  Mother commented on how this was "much better" than how she has been feeding.  Continued to educate while observing baby feeding for a total of 25 minutes.  When she became too sleepy to continue mother placed her STS, burped and baby fell asleep.  MD and RN updated.    Provided comfort gels for home use.   Maternal Data    Feeding    LATCH Score Latch: Grasps breast easily, tongue down, lips flanged, rhythmical sucking.  Audible Swallowing: Spontaneous and intermittent  Type of Nipple: Everted at rest and after stimulation  Comfort (Breast/Nipple): Soft / non-tender  Hold (Positioning): Assistance needed to correctly position infant at breast and maintain latch.  LATCH Score: 9   Lactation Tools Discussed/Used    Interventions Interventions: Breast feeding basics reviewed;Assisted with latch;Skin to skin;Breast massage;Hand express;Adjust position;Breast compression;Position options;Support pillows;Coconut oil;Education  Discharge Discharge Education: Engorgement and breast care Pump: Manual  Consult Status Consult Status: Complete Date: 08/23/20 Follow-up type: Call as needed    Caress Reffitt R Itzae Mccurdy 08/23/2020, 11:55 AM

## 2020-08-24 ENCOUNTER — Telehealth: Payer: Self-pay

## 2020-08-24 ENCOUNTER — Inpatient Hospital Stay (HOSPITAL_COMMUNITY): Payer: 59

## 2020-08-24 LAB — SURGICAL PATHOLOGY

## 2020-08-24 NOTE — Telephone Encounter (Signed)
Transition Care Management Follow-up Telephone Call  Date of discharge and from where: 08/23/2020 from [facility].   How have you been since you were released from the hospital? Pt states that she is feeling very good with some soreness but overall she is feeling well.   Any questions or concerns? No  Items Reviewed:  Did the pt receive and understand the discharge instructions provided? Yes   Medications obtained and verified? Yes   Other? No   Any new allergies since your discharge? No   Dietary orders reviewed? N/A  Do you have support at home? Yes   Functional Questionnaire: (I = Independent and D = Dependent) ADLs: I  Bathing/Dressing- I  Meal Prep- I  Eating- I  Maintaining continence- I  Transferring/Ambulation- I  Managing Meds- I   Follow up appointments reviewed:    Specialist Hospital f/u appt confirmed? No  None listed.   Are transportation arrangements needed? No  If their condition worsens, is the pt aware to call PCP or go to the Emergency Dept.? Yes Was the patient provided with contact information for the PCP's office or ED? Yes Was to pt encouraged to call back with questions or concerns? Yes

## 2021-01-31 ENCOUNTER — Telehealth: Payer: Self-pay | Admitting: *Deleted

## 2021-01-31 MED ORDER — DOXYLAMINE-PYRIDOXINE 10-10 MG PO TBEC: 2.0000 | DELAYED_RELEASE_TABLET | Freq: Every day | ORAL | 5 refills | Status: DC

## 2021-01-31 MED ORDER — PROMETHAZINE HCL 25 MG PO TABS: 25.0000 mg | ORAL_TABLET | Freq: Four times a day (QID) | ORAL | 2 refills | Status: DC | PRN

## 2021-01-31 NOTE — Telephone Encounter (Signed)
Pt called requesting meds for nausea and also states she is having pain in her incision from her section in April, pt is early pregnant (unsure of dates) and has New Ob intake appt coming in about 3 weeks. Explained that pt may feel pain in that area due to the short term interval between pregnancies and that she take Tylenol abut if pain becomes more severe to go to MAU. We will send in meds for nausea.

## 2021-02-07 ENCOUNTER — Encounter (HOSPITAL_COMMUNITY): Payer: Self-pay | Admitting: Obstetrics and Gynecology

## 2021-02-07 ENCOUNTER — Other Ambulatory Visit: Payer: Self-pay

## 2021-02-07 ENCOUNTER — Inpatient Hospital Stay (HOSPITAL_COMMUNITY)
Admission: AD | Admit: 2021-02-07 | Discharge: 2021-02-07 | Disposition: A | Discharge status: Home / Self Care | Payer: Medicaid Other | Attending: Obstetrics and Gynecology | Admitting: Obstetrics and Gynecology

## 2021-02-07 ENCOUNTER — Inpatient Hospital Stay (HOSPITAL_COMMUNITY): Payer: Medicaid Other

## 2021-02-07 DIAGNOSIS — Z3A09 9 weeks gestation of pregnancy: Secondary | ICD-10-CM | POA: Diagnosis not present

## 2021-02-07 DIAGNOSIS — Z3A01 Less than 8 weeks gestation of pregnancy: Secondary | ICD-10-CM | POA: Diagnosis not present

## 2021-02-07 DIAGNOSIS — O209 Hemorrhage in early pregnancy, unspecified: Secondary | ICD-10-CM | POA: Diagnosis not present

## 2021-02-07 DIAGNOSIS — F32A Depression, unspecified: Secondary | ICD-10-CM | POA: Insufficient documentation

## 2021-02-07 DIAGNOSIS — Z886 Allergy status to analgesic agent status: Secondary | ICD-10-CM | POA: Insufficient documentation

## 2021-02-07 DIAGNOSIS — F418 Other specified anxiety disorders: Secondary | ICD-10-CM | POA: Insufficient documentation

## 2021-02-07 DIAGNOSIS — F419 Anxiety disorder, unspecified: Secondary | ICD-10-CM | POA: Insufficient documentation

## 2021-02-07 DIAGNOSIS — O4691 Antepartum hemorrhage, unspecified, first trimester: Secondary | ICD-10-CM

## 2021-02-07 LAB — URINALYSIS, ROUTINE W REFLEX MICROSCOPIC
Bacteria, UA: NONE SEEN
Bilirubin Urine: NEGATIVE
Glucose, UA: NEGATIVE mg/dL
Hgb urine dipstick: NEGATIVE
Ketones, ur: NEGATIVE mg/dL
Nitrite: NEGATIVE
Protein, ur: NEGATIVE mg/dL
Specific Gravity, Urine: 1.019 (ref 1.005–1.030)
pH: 5 (ref 5.0–8.0)

## 2021-02-07 LAB — HCG, QUANTITATIVE, PREGNANCY: hCG, Beta Chain, Quant, S: 7063 m[IU]/mL — ABNORMAL HIGH (ref <5)

## 2021-02-07 LAB — CBC
HCT: 38.6 % (ref 36.0–46.0)
Hemoglobin: 13.3 g/dL (ref 12.0–15.0)
MCH: 28.7 pg (ref 26.0–34.0)
MCHC: 34.5 g/dL (ref 30.0–36.0)
MCV: 83.4 fL (ref 80.0–100.0)
Platelets: 300 10*3/uL (ref 150–400)
RBC: 4.63 MIL/uL (ref 3.87–5.11)
RDW: 12.9 % (ref 11.5–15.5)
WBC: 6.2 10*3/uL (ref 4.0–10.5)
nRBC: 0 % (ref 0.0–0.2)

## 2021-02-07 LAB — WET PREP, GENITAL
Clue Cells Wet Prep HPF POC: NONE SEEN
Sperm: NONE SEEN
Trich, Wet Prep: NONE SEEN
Yeast Wet Prep HPF POC: NONE SEEN

## 2021-02-07 LAB — POCT PREGNANCY, URINE: Preg Test, Ur: POSITIVE — AB

## 2021-02-07 NOTE — MAU Provider Note (Addendum)
History     CSN: [id number]  Arrival date and time: 02/07/21 8527   Event Date/Time   First Provider Initiated Contact with Patient 02/07/21 [phone]      Chief Complaint  Patient presents with   Incisional Pain   Vaginal Bleeding   HPI Rhonda Massey is a 20 y.o. G5P1031 at [redacted]w[redacted]d by unsure LMP who presents with incisional pain & vaginal bleeding.  She had a C-section in February.  For the last several weeks has had intermittent sharp pains in the left part of her incision. Rates pain 6/10 when it occurs. Nothing makes better or worse.  Noticed bright red bleeding on the pad earlier this morning.  Not saturating pads or passing clots.  Currently not bleeding.  Denies abdominal pain other than the incisional pains.  OB History     Gravida  5   Para  1   Term  1   Preterm  0   AB  3   Living  1      SAB  3   IAB  0   Ectopic  0   Multiple  0   Live Births  1           Past Medical History:  Diagnosis Date   Depression Knee Dysplasia    Past Surgical History:  Procedure Laterality Date   APPENDECTOMY  2018   CESAREAN SECTION N/A 08/21/2020   Procedure: CESAREAN SECTION;  Surgeon: Mitchel Honour, DO;  Location: [facility] LD ORS;  Service: Obstetrics;  Laterality: N/A;   KNEE SURGERY     LAPAROSCOPIC APPENDECTOMY N/A 03/02/2017   Procedure: APPENDECTOMY LAPAROSCOPIC;  Surgeon: Jimmye Norman, MD;  Location: [facility] OR;  Service: General;  Laterality: N/A;   WISDOM TOOTH EXTRACTION      Family History  Problem Relation Age of Onset   Cancer Maternal Grandmother        breast    Social History   Tobacco Use   Smoking status: Never   Smokeless tobacco: Never  Vaping Use   Vaping Use: Never used  Substance Use Topics   Alcohol use: Never   Drug use: Never    Allergies:  Allergies  Allergen Reactions   Ibuprofen Nausea And Vomiting    Medications Prior to Admission  Medication Sig Dispense Refill Last Dose   acetaminophen (TYLENOL) 500 MG tablet  Take 2 tablets (1,000 mg total) by mouth every 8 (eight) hours as needed. 30 tablet 0 02/06/2021   Doxylamine-Pyridoxine (DICLEGIS) 10-10 MG TBEC Take 2 tablets by mouth at bedtime. If symptoms persist, add one tablet in the morning and one in the afternoon 100 tablet 5    oxyCODONE (OXY IR/ROXICODONE) 5 MG immediate release tablet Take 1 tablet (5 mg total) by mouth every 6 (six) hours as needed for moderate pain. 20 tablet 0    Prenatal Vit-Fe Fumarate-FA (PRENATAL MULTIVITAMIN) TABS tablet Take 1 tablet by mouth daily at 12 noon.      promethazine (PHENERGAN) 25 MG tablet Take 1 tablet (25 mg total) by mouth every 6 (six) hours as needed for nausea or vomiting. 30 tablet 2     Review of Systems  Constitutional: Negative.   Gastrointestinal:  Positive for abdominal pain.  Genitourinary:  Positive for vaginal bleeding.  Physical Exam   Blood pressure 115/71, pulse 97, temperature 98.6 F (37 C), temperature source Oral, resp. rate 16, height 5\' 1"  (1.549 m), weight 81.6 kg, last menstrual period 12/03/2020, SpO2 98 %, unknown if  currently breastfeeding.  Physical Exam Vitals and nursing note reviewed.  Constitutional:      General: She is not in acute distress.    Appearance: Normal appearance. She is not ill-appearing.  HENT:     Head: Normocephalic and atraumatic.  Eyes:     General: No scleral icterus. Pulmonary:     Effort: Pulmonary effort is normal. No respiratory distress.  Abdominal:     General: Abdomen is flat. There is no distension.     Tenderness: There is no abdominal tenderness.     Comments: C/section scar well healed  Skin:    General: Skin is warm and dry.  Neurological:     Mental Status: She is alert.  Psychiatric:        Mood and Affect: Mood normal.        Behavior: Behavior normal.    MAU Course  Procedures Results for orders placed or performed during the hospital encounter of 02/07/21 (from the past 24 hour(s))  Pregnancy, urine POC     Status:  Abnormal   Collection Time: 02/07/21  6:18 AM  Result Value Ref Range   Preg Test, Ur POSITIVE (A) NEGATIVE  Urinalysis, Routine w reflex microscopic     Status: Abnormal   Collection Time: 02/07/21  6:21 AM  Result Value Ref Range   Color, Urine YELLOW YELLOW   APPearance HAZY (A) CLEAR   Specific Gravity, Urine 1.019 1.005 - 1.030   pH 5.0 5.0 - 8.0   Glucose, UA NEGATIVE NEGATIVE mg/dL   Hgb urine dipstick NEGATIVE NEGATIVE   Bilirubin Urine NEGATIVE NEGATIVE   Ketones, ur NEGATIVE NEGATIVE mg/dL   Protein, ur NEGATIVE NEGATIVE mg/dL   Nitrite NEGATIVE NEGATIVE   Leukocytes,Ua SMALL (A) NEGATIVE   RBC / HPF 0-5 0 - 5 RBC/hpf   WBC, UA 0-5 0 - 5 WBC/hpf   Bacteria, UA NONE SEEN NONE SEEN   Squamous Epithelial / LPF 11-20 0 - 5   Mucus PRESENT    No results found.  MDM +UPT UA, wet prep, GC/chlamydia, CBC, ABO/Rh, quant hCG, and [place] today to rule out ectopic pregnancy which can be life threatening.   Care turned over to [facility] FNP Judeth Horn, NP 02/07/2021 8:25 AM  Care assumed at 0825 Review [place] in detail with the patient. Gestational age changed per [place] dating.   Assessment and Plan   A:  1. [redacted] weeks gestation of pregnancy   2. Vaginal bleeding in pregnancy, first trimester      P:  Discharge home in stable condition Return to MAU if symptoms worsen Keep your New OB appointment as scheduled  Azaya Goedde, Harolyn Rutherford, NP 02/07/2021 2:18 PM

## 2021-02-07 NOTE — MAU Note (Signed)
Pt arrives to MAU with her 23 month old infant.  Consulted Marylene Land, [facility], who stated that the pt can have baby with her w/o someone else to care for the baby the pt will not be able to leave for U/S, scans, etc .  Pt advised to get infant seat as the pt needed to get her weight and vitals.  Pt states that she will go to her car and get the infant seat and return for care.  Judeth Horn, NP advised.  Addison Naegeli, RN  02/07/21

## 2021-02-07 NOTE — MAU Note (Signed)
Pt presents to MAU with c/o newly pregnant finding out about two weeks ago.  Pt also reports having bleeding and pain at her c-section incision site.   Pt states that she contacted an office and was informed that if her pain gets any worse to come to here.  Pt reports that she took Tylenol as is not effective.

## 2021-02-08 LAB — GC/CHLAMYDIA PROBE AMP (~~LOC~~) NOT AT ARMC
Chlamydia: NEGATIVE
Comment: NEGATIVE
Comment: NORMAL
Neisseria Gonorrhea: NEGATIVE

## 2021-02-22 ENCOUNTER — Ambulatory Visit (INDEPENDENT_AMBULATORY_CARE_PROVIDER_SITE_OTHER): Payer: Medicaid Other

## 2021-02-22 ENCOUNTER — Ambulatory Visit (INDEPENDENT_AMBULATORY_CARE_PROVIDER_SITE_OTHER): Payer: Medicaid Other | Admitting: *Deleted

## 2021-02-22 ENCOUNTER — Other Ambulatory Visit: Payer: Self-pay

## 2021-02-22 VITALS — BP 124/76 | HR 100 | Wt 179.0 lb

## 2021-02-22 DIAGNOSIS — O3680X0 Pregnancy with inconclusive fetal viability, not applicable or unspecified: Secondary | ICD-10-CM

## 2021-02-22 DIAGNOSIS — Z3A01 Less than 8 weeks gestation of pregnancy: Secondary | ICD-10-CM | POA: Diagnosis not present

## 2021-02-22 DIAGNOSIS — Z3481 Encounter for supervision of other normal pregnancy, first trimester: Secondary | ICD-10-CM | POA: Diagnosis not present
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
## 2021-03-03 ENCOUNTER — Encounter: Payer: Self-pay | Admitting: Radiology

## 2021-03-22 ENCOUNTER — Encounter: Payer: Self-pay | Admitting: Obstetrics and Gynecology

## 2021-03-22 ENCOUNTER — Ambulatory Visit (INDEPENDENT_AMBULATORY_CARE_PROVIDER_SITE_OTHER): Payer: Medicaid Other | Admitting: Obstetrics and Gynecology

## 2021-03-22 ENCOUNTER — Other Ambulatory Visit (HOSPITAL_COMMUNITY)
Admission: RE | Admit: 2021-03-22 | Discharge: 2021-03-22 | Disposition: A | Discharge status: Home / Self Care | Payer: Medicaid Other | Source: Ambulatory Visit | Attending: Obstetrics and Gynecology | Admitting: Obstetrics and Gynecology

## 2021-03-22 ENCOUNTER — Other Ambulatory Visit: Payer: Self-pay

## 2021-03-22 VITALS — BP 125/78 | HR 98 | Wt 177.2 lb

## 2021-03-22 DIAGNOSIS — Z31430 Encounter of female for testing for genetic disease carrier status for procreative management: Secondary | ICD-10-CM | POA: Diagnosis not present

## 2021-03-22 DIAGNOSIS — R42 Dizziness and giddiness: Secondary | ICD-10-CM

## 2021-03-22 DIAGNOSIS — Z3480 Encounter for supervision of other normal pregnancy, unspecified trimester: Secondary | ICD-10-CM | POA: Insufficient documentation

## 2021-03-22 DIAGNOSIS — Z3A09 9 weeks gestation of pregnancy: Secondary | ICD-10-CM

## 2021-03-22 DIAGNOSIS — Z98891 History of uterine scar from previous surgery: Secondary | ICD-10-CM | POA: Diagnosis not present

## 2021-03-22 DIAGNOSIS — O09891 Supervision of other high risk pregnancies, first trimester: Secondary | ICD-10-CM | POA: Diagnosis not present

## 2021-03-22 DIAGNOSIS — O09899 Supervision of other high risk pregnancies, unspecified trimester: Secondary | ICD-10-CM | POA: Insufficient documentation

## 2021-03-22 DIAGNOSIS — O99211 Obesity complicating pregnancy, first trimester: Secondary | ICD-10-CM

## 2021-03-22 DIAGNOSIS — O99210 Obesity complicating pregnancy, unspecified trimester: Secondary | ICD-10-CM | POA: Insufficient documentation

## 2021-03-22 DIAGNOSIS — Z1329 Encounter for screening for other suspected endocrine disorder: Secondary | ICD-10-CM | POA: Diagnosis not present

## 2021-03-22 DIAGNOSIS — E669 Obesity, unspecified: Secondary | ICD-10-CM | POA: Diagnosis not present

## 2021-03-22 DIAGNOSIS — Z3481 Encounter for supervision of other normal pregnancy, first trimester: Secondary | ICD-10-CM | POA: Diagnosis not present

## 2021-03-22 DIAGNOSIS — Z131 Encounter for screening for diabetes mellitus: Secondary | ICD-10-CM | POA: Diagnosis not present

## 2021-03-22 MED ORDER — BLOOD PRESSURE CUFF MISC: 1.0000 | 0 refills | Status: DC
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
## 2021-03-23 LAB — HCV INTERPRETATION

## 2021-03-23 LAB — CBC/D/PLT+RPR+RH+ABO+RUBIGG...
Antibody Screen: NEGATIVE
Basophils Absolute: 0 10*3/uL (ref 0.0–0.2)
Basos: 1 %
EOS (ABSOLUTE): 0.1 10*3/uL (ref 0.0–0.4)
Eos: 2 %
HCV Ab: <0.1 s/co ratio (ref 0.0–0.9)
HIV Screen 4th Generation wRfx: NONREACTIVE
Hematocrit: 41.4 % (ref 34.0–46.6)
Hemoglobin: 14 g/dL (ref 11.1–15.9)
Hepatitis B Surface Ag: NEGATIVE
Immature Grans (Abs): 0 10*3/uL (ref 0.0–0.1)
Immature Granulocytes: 0 %
Lymphocytes Absolute: 2.1 10*3/uL (ref 0.7–3.1)
Lymphs: 27 %
MCH: 28.6 pg (ref 26.6–33.0)
MCHC: 33.8 g/dL (ref 31.5–35.7)
MCV: 85 fL (ref 79–97)
Monocytes Absolute: 0.5 10*3/uL (ref 0.1–0.9)
Monocytes: 7 %
Neutrophils Absolute: 4.9 10*3/uL (ref 1.4–7.0)
Neutrophils: 63 %
Platelets: 326 10*3/uL (ref 150–450)
RBC: 4.89 x10E6/uL (ref 3.77–5.28)
RDW: 12.8 % (ref 11.7–15.4)
RPR Ser Ql: NONREACTIVE
Rh Factor: POSITIVE
Rubella Antibodies, IGG: 2 index (ref >0.99)
WBC: 7.7 10*3/uL (ref 3.4–10.8)

## 2021-03-23 LAB — COMPREHENSIVE METABOLIC PANEL
ALT: 14 IU/L (ref 0–32)
AST: 15 IU/L (ref 0–40)
Albumin/Globulin Ratio: 2.1 (ref 1.2–2.2)
Albumin: 4.8 g/dL (ref 3.9–5.0)
Alkaline Phosphatase: 69 IU/L (ref 44–121)
BUN/Creatinine Ratio: 14 (ref 9–23)
BUN: 6 mg/dL (ref 6–20)
Bilirubin Total: 0.5 mg/dL (ref 0.0–1.2)
CO2: 19 mmol/L — ABNORMAL LOW (ref 20–29)
Calcium: 9.7 mg/dL (ref 8.7–10.2)
Chloride: 102 mmol/L (ref 96–106)
Creatinine, Ser: 0.43 mg/dL — ABNORMAL LOW (ref 0.57–1.00)
Globulin, Total: 2.3 g/dL (ref 1.5–4.5)
Glucose: 94 mg/dL (ref 65–99)
Potassium: 3.6 mmol/L (ref 3.5–5.2)
Sodium: 139 mmol/L (ref 134–144)
Total Protein: 7.1 g/dL (ref 6.0–8.5)
eGFR: 141 mL/min/{1.73_m2} (ref >59)

## 2021-03-23 LAB — HEMOGLOBIN A1C
Est. average glucose Bld gHb Est-mCnc: 100 mg/dL
Hgb A1c MFr Bld: 5.1 % (ref 4.8–5.6)

## 2021-03-23 LAB — PROTEIN / CREATININE RATIO, URINE
Creatinine, Urine: 191.3 mg/dL
Protein, Ur: 15.1 mg/dL
Protein/Creat Ratio: 79 mg/g creat (ref 0–200)

## 2021-03-23 LAB — TSH: TSH: 0.525 u[IU]/mL (ref 0.450–4.500)

## 2021-03-24 ENCOUNTER — Encounter: Payer: Self-pay | Admitting: Radiology

## 2021-03-24 LAB — CERVICOVAGINAL ANCILLARY ONLY
Chlamydia: NEGATIVE
Comment: NEGATIVE
Comment: NEGATIVE
Comment: NORMAL
Neisseria Gonorrhea: NEGATIVE
Trichomonas: NEGATIVE

## 2021-03-24 LAB — URINE CULTURE, OB REFLEX

## 2021-03-24 LAB — CULTURE, OB URINE

## 2021-03-25 ENCOUNTER — Other Ambulatory Visit: Payer: Self-pay | Admitting: Obstetrics and Gynecology

## 2021-03-25 ENCOUNTER — Other Ambulatory Visit: Payer: Self-pay | Admitting: *Deleted

## 2021-03-25 DIAGNOSIS — R42 Dizziness and giddiness: Secondary | ICD-10-CM

## 2021-03-25 NOTE — Progress Notes (Signed)
erroneous

## 2021-03-31 ENCOUNTER — Telehealth: Payer: Self-pay

## 2021-03-31 ENCOUNTER — Encounter: Payer: Self-pay | Admitting: Obstetrics and Gynecology

## 2021-03-31 NOTE — Telephone Encounter (Signed)
Spoke to the pt about the panorama results and she will pick them up in the office at her next appt

## 2021-04-20 ENCOUNTER — Ambulatory Visit (INDEPENDENT_AMBULATORY_CARE_PROVIDER_SITE_OTHER): Payer: Medicaid Other | Admitting: Family Medicine

## 2021-04-20 ENCOUNTER — Other Ambulatory Visit: Payer: Self-pay

## 2021-04-20 ENCOUNTER — Encounter: Payer: Self-pay | Admitting: Family Medicine

## 2021-04-20 VITALS — BP 116/75 | HR 93 | Wt 175.0 lb

## 2021-04-20 DIAGNOSIS — Z98891 History of uterine scar from previous surgery: Secondary | ICD-10-CM

## 2021-04-20 DIAGNOSIS — G43009 Migraine without aura, not intractable, without status migrainosus: Secondary | ICD-10-CM

## 2021-04-20 DIAGNOSIS — O09899 Supervision of other high risk pregnancies, unspecified trimester: Secondary | ICD-10-CM

## 2021-04-20 DIAGNOSIS — Z3480 Encounter for supervision of other normal pregnancy, unspecified trimester: Secondary | ICD-10-CM

## 2021-04-20 DIAGNOSIS — O99210 Obesity complicating pregnancy, unspecified trimester: Secondary | ICD-10-CM

## 2021-04-20 MED ORDER — BUTALBITAL-APAP-CAFFEINE 50-325-40 MG PO CAPS: 1.0000 | ORAL_CAPSULE | Freq: Four times a day (QID) | ORAL | 1 refills | Status: DC | PRN

## 2021-04-20 MED ORDER — CYCLOBENZAPRINE HCL 10 MG PO TABS: 10.0000 mg | ORAL_TABLET | Freq: Three times a day (TID) | ORAL | 1 refills | Status: DC | PRN

## 2021-04-20 MED ORDER — BUTALBITAL-APAP-CAFFEINE 50-325-40 MG PO CAPS: 1.0000 | ORAL_CAPSULE | Freq: Four times a day (QID) | ORAL | 3 refills | Status: DC | PRN
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
## 2021-04-20 NOTE — Progress Notes (Signed)
ROB 14w   FLU Offered : Declines  CC: Migraines no relief w/ tylenol and increasing water intake notes dizzy spells also.

## 2021-04-24 ENCOUNTER — Inpatient Hospital Stay (HOSPITAL_COMMUNITY)
Admission: AD | Admit: 2021-04-24 | Discharge: 2021-04-24 | Disposition: A | Discharge status: Home / Self Care | Payer: Medicaid Other | Attending: Obstetrics & Gynecology | Admitting: Obstetrics & Gynecology

## 2021-04-24 ENCOUNTER — Encounter (HOSPITAL_COMMUNITY): Payer: Self-pay | Admitting: Obstetrics & Gynecology

## 2021-04-24 ENCOUNTER — Inpatient Hospital Stay (HOSPITAL_BASED_OUTPATIENT_CLINIC_OR_DEPARTMENT_OTHER): Payer: Medicaid Other

## 2021-04-24 ENCOUNTER — Other Ambulatory Visit: Payer: Self-pay

## 2021-04-24 DIAGNOSIS — Z8616 Personal history of COVID-19: Secondary | ICD-10-CM | POA: Diagnosis not present

## 2021-04-24 DIAGNOSIS — O209 Hemorrhage in early pregnancy, unspecified: Secondary | ICD-10-CM | POA: Diagnosis not present

## 2021-04-24 DIAGNOSIS — O26892 Other specified pregnancy related conditions, second trimester: Secondary | ICD-10-CM | POA: Insufficient documentation

## 2021-04-24 DIAGNOSIS — O219 Vomiting of pregnancy, unspecified: Secondary | ICD-10-CM

## 2021-04-24 DIAGNOSIS — O4692 Antepartum hemorrhage, unspecified, second trimester: Secondary | ICD-10-CM | POA: Diagnosis not present

## 2021-04-24 DIAGNOSIS — Z3A15 15 weeks gestation of pregnancy: Secondary | ICD-10-CM

## 2021-04-24 DIAGNOSIS — Z3A14 14 weeks gestation of pregnancy: Secondary | ICD-10-CM | POA: Diagnosis not present

## 2021-04-24 DIAGNOSIS — R110 Nausea: Secondary | ICD-10-CM | POA: Insufficient documentation

## 2021-04-24 DIAGNOSIS — Z886 Allergy status to analgesic agent status: Secondary | ICD-10-CM | POA: Insufficient documentation

## 2021-04-24 LAB — URINALYSIS, ROUTINE W REFLEX MICROSCOPIC
Bilirubin Urine: NEGATIVE
Glucose, UA: NEGATIVE mg/dL
Ketones, ur: NEGATIVE mg/dL
Nitrite: NEGATIVE
Protein, ur: NEGATIVE mg/dL
RBC / HPF: >50 RBC/hpf — ABNORMAL HIGH (ref 0–5)
Specific Gravity, Urine: 1.014 (ref 1.005–1.030)
pH: 6 (ref 5.0–8.0)

## 2021-04-24 LAB — WET PREP, GENITAL
Clue Cells Wet Prep HPF POC: NONE SEEN
Sperm: NONE SEEN
Trich, Wet Prep: NONE SEEN
Yeast Wet Prep HPF POC: NONE SEEN

## 2021-04-24 MED ORDER — ONDANSETRON 4 MG PO TBDP
8.0000 mg | ORAL_TABLET | Freq: Once | ORAL | Status: AC
  Administered 2021-04-24: 8 mg via ORAL
  Filled 2021-04-24: qty 2

## 2021-04-24 NOTE — MAU Note (Signed)
Pt reports vaginal bleeding like a period that started 30 minutes ago. Also reports abd cramping x 24 hours. Nausea x one hour. Last intercourse was 2 hours ago.

## 2021-04-24 NOTE — MAU Provider Note (Signed)
History     CSN: [id number]  Arrival date and time: 04/24/21 8453   Event Date/Time   First Provider Initiated Contact with Patient 04/24/21 0425      Chief Complaint  Patient presents with   Vaginal Bleeding   Rhonda Massey is a 20 y.o. G5P1031 at [redacted]w[redacted]d who receives care at [facility].  She presents today for Vaginal Bleeding.  She reports she started to experience bright red bleeding ~30 minutes prior to arrival.  She endorses bleeding upon arrival, but only with wiping.She states she was also experiencing cramping at the time, but that has since resolved.  Patient also reports nausea prior to onset of symptoms that is still present.  She reports sex 2 hours prior to bleeding onset. Patient denies vaginal discharge or issues with urination.  She denies h/o STDs. She reports 2 bouts of loose stool this afternoon and some dizziness in the morning.  She also reports current headache that she rates 6/10.    OB History     Gravida  5   Para  1   Term  1   Preterm  0   AB  3   Living  1      SAB  3   IAB  0   Ectopic  0   Multiple  0   Live Births  1           Past Medical History:  Diagnosis Date   COVID-19 virus infection 07/16/2020   Symptoms started on 1/10; patient reports much improved on 1/14   Depression Knee Dysplasia    Past Surgical History:  Procedure Laterality Date   APPENDECTOMY  2018   CESAREAN SECTION N/A 08/21/2020   Procedure: CESAREAN SECTION;  Surgeon: Mitchel Honour, DO;  Location: [facility]S;  Service: Obstetrics;  Laterality: N/A;   KNEE SURGERY     LAPAROSCOPIC APPENDECTOMY N/A 03/02/2017   Procedure: APPENDECTOMY LAPAROSCOPIC;  Surgeon: Jimmye Norman, MD;  Location: [facility] OR;  Service: General;  Laterality: N/A;   WISDOM TOOTH EXTRACTION      Family History  Problem Relation Age of Onset   Cancer Maternal Grandmother        breast    Social History   Tobacco Use   Smoking status: Never   Smokeless tobacco: Never   Vaping Use   Vaping Use: Never used  Substance Use Topics   Alcohol use: Never   Drug use: Never    Allergies:  Allergies  Allergen Reactions   Ibuprofen Nausea And Vomiting    Medications Prior to Admission  Medication Sig Dispense Refill Last Dose   Blood Pressure Monitoring (BLOOD PRESSURE CUFF) MISC 1 Device by Does not apply route once a week. 1 each 0    Butalbital-APAP-Caffeine 50-325-40 MG capsule Take 1-2 capsules by mouth every 6 (six) hours as needed for headache. 30 capsule 1 04/22/2021   cyclobenzaprine (FLEXERIL) 10 MG tablet Take 1 tablet (10 mg total) by mouth every 8 (eight) hours as needed for muscle spasms. 30 tablet 1    Doxylamine-Pyridoxine (DICLEGIS) 10-10 MG TBEC Take 2 tablets by mouth at bedtime. If symptoms persist, add one tablet in the morning and one in the afternoon 100 tablet 5    Prenatal Vit-Fe Fumarate-FA (PRENATAL MULTIVITAMIN) TABS tablet Take 1 tablet by mouth daily at 12 noon.   04/22/2021   promethazine (PHENERGAN) 25 MG tablet Take 1 tablet (25 mg total) by mouth every 6 (six) hours as needed for nausea  or vomiting. 30 tablet 2     Review of Systems  Gastrointestinal:  Positive for diarrhea and nausea. Negative for vomiting.  Genitourinary:  Positive for vaginal bleeding. Negative for vaginal discharge.  Neurological:  Positive for headaches. Negative for dizziness and light-headedness.  Physical Exam   Blood pressure 128/73, pulse 95, temperature 98.6 F (37 C), temperature source Oral, resp. rate 15, height 5\' 1"  (1.549 m), weight 79.8 kg, last menstrual period 12/03/2020, SpO2 98 %, unknown if currently breastfeeding.  Physical Exam Vitals reviewed.  Constitutional:      Appearance: Normal appearance.  HENT:     Head: Normocephalic and atraumatic.  Eyes:     Conjunctiva/sclera: Conjunctivae normal.  Cardiovascular:     Rate and Rhythm: Normal rate.  Pulmonary:     Effort: Pulmonary effort is normal. No respiratory distress.   Abdominal:     General: Bowel sounds are normal.     Tenderness: There is abdominal tenderness.  Genitourinary:    Comments: Speculum Exam: -Normal External Genitalia: Non tender, no apparent discharge, but scant blood at introitus. Dried blood noted on gluteal folds and labias. -Vaginal Vault: Pink mucosa with good rugae. Scant amt pinkish red blood noted -wet prep collected -Cervix:Pink, no lesions, cysts, or polyps.  Appears closed. No active bleeding from os-GC/CT collected-Ectropic and Friable -Bimanual Exam:  Closed   Musculoskeletal:     Cervical back: Normal range of motion.  Skin:    General: Skin is warm and dry.  Neurological:     Mental Status: She is alert and oriented to person, place, and time.  Psychiatric:        Mood and Affect: Mood normal.        Behavior: Behavior normal.        Thought Content: Thought content normal.    MAU Course  Procedures Results for orders placed or performed during the hospital encounter of 04/24/21 (from the past 24 hour(s))  Urinalysis, Routine w reflex microscopic Urine, Clean Catch     Status: Abnormal   Collection Time: 04/24/21  3:53 AM  Result Value Ref Range   Color, Urine YELLOW YELLOW   APPearance HAZY (A) CLEAR   Specific Gravity, Urine 1.014 1.005 - 1.030   pH 6.0 5.0 - 8.0   Glucose, UA NEGATIVE NEGATIVE mg/dL   Hgb urine dipstick LARGE (A) NEGATIVE   Bilirubin Urine NEGATIVE NEGATIVE   Ketones, ur NEGATIVE NEGATIVE mg/dL   Protein, ur NEGATIVE NEGATIVE mg/dL   Nitrite NEGATIVE NEGATIVE   Leukocytes,Ua TRACE (A) NEGATIVE   RBC / HPF >50 (H) 0 - 5 RBC/hpf   WBC, UA 0-5 0 - 5 WBC/hpf   Bacteria, UA RARE (A) NONE SEEN   Squamous Epithelial / LPF 6-10 0 - 5   Mucus PRESENT   Wet prep, genital     Status: Abnormal   Collection Time: 04/24/21  4:40 AM   Specimen: Vaginal  Result Value Ref Range   Yeast Wet Prep HPF POC NONE SEEN NONE SEEN   Trich, Wet Prep NONE SEEN NONE SEEN   Clue Cells Wet Prep HPF POC NONE  SEEN NONE SEEN   WBC, Wet Prep HPF POC FEW (A) NONE SEEN   Sperm NONE SEEN     MDM Pelvic Exam; Wet Prep and GC/CT Labs: UA Ultrasound Antiemetic Assessment and Plan  20 year old, 19  SIUP at 14.4 weeks Vaginal Bleeding Nausea  -Reviewed POC with patient. -Exam performed and findings discussed.  -Cultures collected and pending. -  Informed that bleeding likely coming from friable cervix. -Discussed activities, such as sex, that can cause cervical bleeding.  -Patient encouraged to report and/or come to MAU for any vaginal bleeding as she deems appropriate.  -Will give zofran for nausea -Will send for [place] and await preliminary results.   Cherre Robins 04/24/2021, 4:26 AM   Reassessment (5:36 AM)  -Report returns with posterior placenta and AFV WNL. -Patient updated on findings. -Patient declines antiemetic script. -Bleeding Precautions Given. -Follow up as scheduled. -Encouraged to call primary office or return to MAU if symptoms worsen or with the onset of new symptoms. -Discharged to home in stable condition.  Cherre Robins MSN, CNM Advanced Practice Provider, Center for [employer]

## 2021-04-25 ENCOUNTER — Encounter: Payer: Self-pay | Admitting: *Deleted

## 2021-04-25 LAB — GC/CHLAMYDIA PROBE AMP (~~LOC~~) NOT AT ARMC
Chlamydia: NEGATIVE
Comment: NEGATIVE
Comment: NORMAL
Neisseria Gonorrhea: NEGATIVE

## 2021-05-12 ENCOUNTER — Telehealth: Payer: Self-pay

## 2021-05-12 NOTE — Telephone Encounter (Signed)
TC from pt regarding nausea and vomiting Pt noticed blood while vomiting and is concerned has had at least 4 episodes of vomiting today last ate 2 hrs ago Noted has not took prescribed nausea medication in last 2 days wanted to be sure it was "ok" to take Rx at this time. Pt advised of BRAT Diet, comfort measure as such to not let stomach get empty keep crackers at bed side, ice chips, increase fluid intake. Pt requested Note to be out of work  Pt advised she has not been evaluated here for Nausea and vomiting and no note could be provider Pt also advise if sx's become worse , bleeding while vomiting is severe she needs to go to MAU.  Pt voiced understanding.

## 2021-05-12 NOTE — Telephone Encounter (Signed)
Return call to pt regarding triage vm  No answer went directly to VM at 3:24pm LVM for pt to contact the office.

## 2021-05-13 ENCOUNTER — Telehealth (INDEPENDENT_AMBULATORY_CARE_PROVIDER_SITE_OTHER): Payer: Medicaid Other | Admitting: Cardiology

## 2021-05-13 VITALS — HR 88 | Ht 60.0 in | Wt 174.0 lb

## 2021-05-13 DIAGNOSIS — R42 Dizziness and giddiness: Secondary | ICD-10-CM | POA: Diagnosis not present

## 2021-05-13 NOTE — Progress Notes (Signed)
Cardio-Obstetrics Clinic  New Evaluation  Date:  05/13/2021   ID:  Rhonda Massey, DOB February 14, 2001, MRN [id number]  PCP:  Pcp, No   [facility] Providers Cardiologist:  None  Electrophysiologist:  None       Referring MD: [place] Bing, MD   Chief Complaint: " I was really dizzy but is improving now"  History of Present Illness:    Rhonda Massey is a 20 y.o. female [G5P1031] who is being seen today for the evaluation of lightheadedness and dizziness at the request of [place] Bing, MD.  Other medical history includes depression and history of COVID-19 infection.  The patient tells me that she has been experiencing intermittent dizziness.  She notes that mostly when she is going downhill or down the stairs is when she experiences this.  She tells me never upstairs.  She does it when she feels says it feels as if her head is swelling and she is going to pass out but she has not passed out.  Thankfully of the last several weeks this has improved.  She denies any chest pain or any shortness of breath.  Prior CV Studies Reviewed: The following studies were reviewed today: None available  Past Medical History:  Diagnosis Date   COVID-19 virus infection 07/16/2020   Symptoms started on 1/10; patient reports much improved on 1/14   Depression Knee Dysplasia    Past Surgical History:  Procedure Laterality Date   APPENDECTOMY  2018   CESAREAN SECTION N/A 08/21/2020   Procedure: CESAREAN SECTION;  Surgeon: Mitchel Honour, DO;  Location: [facility] LD ORS;  Service: Obstetrics;  Laterality: N/A;   KNEE SURGERY     LAPAROSCOPIC APPENDECTOMY N/A 03/02/2017   Procedure: APPENDECTOMY LAPAROSCOPIC;  Surgeon: Jimmye Norman, MD;  Location: [facility] OR;  Service: General;  Laterality: N/A;   WISDOM TOOTH EXTRACTION        OB History     Gravida  5   Para  1   Term  1   Preterm  0   AB  3   Living  1      SAB  3   IAB  0   Ectopic  0   Multiple  0   Live  Births  1               Current Medications: Current Meds  Medication Sig   Blood Pressure Monitoring (BLOOD PRESSURE CUFF) MISC 1 Device by Does not apply route once a week.   Doxylamine-Pyridoxine (DICLEGIS) 10-10 MG TBEC Take 2 tablets by mouth at bedtime. If symptoms persist, add one tablet in the morning and one in the afternoon   Prenatal Vit-Fe Fumarate-FA (PRENATAL MULTIVITAMIN) TABS tablet Take 1 tablet by mouth daily at 12 noon.     Allergies:   Ibuprofen   Social History   Socioeconomic History   Marital status: Single    Spouse name: Not on file   Number of children: Not on file   Years of education: Not on file   Highest education level: Not on file  Occupational History   Not on file  Tobacco Use   Smoking status: Never   Smokeless tobacco: Never  Vaping Use   Vaping Use: Never used  Substance and Sexual Activity   Alcohol use: Never   Drug use: Never   Sexual activity: Yes  Other Topics Concern   Not on file  Social History Narrative   ** Merged History Encounter **  Social Determinants of Health   Financial Resource Strain: Not on file  Food Insecurity: Not on file  Transportation Needs: Not on file  Physical Activity: Not on file  Stress: Not on file  Social Connections: Not on file      Family History  Problem Relation Age of Onset   Cancer Maternal Grandmother        breast      ROS:   Please see the history of present illness.     Review of Systems  Constitution: Report dizziness.  Negative for decreased appetite, fever and weight gain.  HENT: Negative for congestion, ear discharge, hoarse voice and sore throat.   Eyes: Negative for discharge, redness, vision loss in right eye and visual halos.  Cardiovascular: Negative for chest pain, dyspnea on exertion, leg swelling, orthopnea and palpitations.  Respiratory: Negative for cough, hemoptysis, shortness of breath and snoring.   Endocrine: Negative for heat intolerance and  polyphagia.  Hematologic/Lymphatic: Negative for bleeding problem. Does not bruise/bleed easily.  Skin: Negative for flushing, nail changes, rash and suspicious lesions.  Musculoskeletal: Negative for arthritis, joint pain, muscle cramps, myalgias, neck pain and stiffness.  Gastrointestinal: Negative for abdominal pain, bowel incontinence, diarrhea and excessive appetite.  Genitourinary: Negative for decreased libido, genital sores and incomplete emptying.  Neurological: Negative for brief paralysis, focal weakness, headaches and loss of balance.  Psychiatric/Behavioral: Negative for altered mental status, depression and suicidal ideas.  Allergic/Immunologic: Negative for HIV exposure and persistent infections.     Labs/EKG Reviewed:    EKG:   Virtual visit EKG not performed.  Recent Labs: 03/22/2021: ALT 14; BUN 6; Creatinine, Ser 0.43; Hemoglobin 14.0; Platelets 326; Potassium 3.6; Sodium 139; TSH 0.525   Recent Lipid Panel No results found for: CHOL, TRIG, HDL, CHOLHDL, LDLCALC, LDLDIRECT  Physical Exam:    VS:  Pulse 88   Ht 5' (1.524 m)   Wt 174 lb (78.9 kg)   LMP 12/03/2020 (Approximate)   BMI 33.98 kg/m     Wt Readings from Last 3 Encounters:  05/13/21 174 lb (78.9 kg)  04/24/21 176 lb (79.8 kg)  04/20/21 175 lb (79.4 kg)    Virtual visit physical exam not performed.  Risk Assessment/Risk Calculators:     CARPREG II Risk Prediction Index Score:  1.  The patient's risk for a primary cardiac event is 5%.   Modified World Health Organization [facility]) Classification of Maternal CV Risk   Class I         ASSESSMENT & PLAN:   Dizziness -she tells me that all the symptoms has improved.  But at this time we talked about the potential causes.  She will watch her symptoms and be very vigilant and if this recur the patient was notify me at once.  And at which time we will send a monitor for the patient.  In the meantime she is going to get a Kardia mobile device which will  help her record her EKG at once if she experienced any symptoms.  She will follow-up as needed.  Patient Instructions  Medication Instructions:  Your physician recommends that you continue on your current medications as directed. Please refer to the Current Medication list given to you today.  *If you need a refill on your cardiac medications before your next appointment, please call your pharmacy*   Lab Work: None If you have labs (blood work) drawn today and your tests are completely normal, you will receive your results only by: MyChart Message (if  you have MyChart) OR A paper copy in the mail If you have any lab test that is abnormal or we need to change your treatment, we will call you to review the results.   Testing/Procedures:    Follow-Up: At [facility], you and your health needs are our priority.  As part of our continuing mission to provide you with exceptional heart care, we have created designated Provider Care Teams.  These Care Teams include your primary Cardiologist (physician) and Advanced Practice Providers (APPs -  Physician Assistants and Nurse Practitioners) who all work together to provide you with the care you need, when you need it.  We recommend signing up for the patient portal called "MyChart".  Sign up information is provided on this After Visit Summary.  MyChart is used to connect with patients for Virtual Visits (Telemedicine).  Patients are able to view lab/test results, encounter notes, upcoming appointments, etc.  Non-urgent messages can be sent to your provider as well.   To learn more about what you can do with MyChart, go to [email or web address].    Your next appointment:   As needed  The format for your next appointment:   In Person  Provider:   Thomasene Ripple  [facility] [address], [place], [place] [address]   Other Instructions  KardiaMobile [email or web address]        FDA-cleared,  clinical grade mobile EKG monitor: Lourena Simmonds is the most clinically-validated mobile EKG used by the world's leading cardiac care medical professionals With Basic service, know instantly if your heart rhythm is normal or if atrial fibrillation is detected, and email the last single EKG recording to yourself or your doctor Premium service, available for purchase through the Kardia app for $9.99 per month or $99 per year, includes unlimited history and storage of your EKG recordings, a monthly EKG summary report to share with your doctor, along with the ability to track your blood pressure, activity and weight Includes one KardiaMobile phone clip FREE SHIPPING: Standard delivery 1-3 business days. Orders placed by 11:00am PST will ship that afternoon. Otherwise, will ship next business day. All orders ship via [employer] from [place], [place]    [employer] - sending an EKG [employer] and set up profile. Run EKG - by placing 1-2 fingers on the silver plates After EKG is complete - Download PDF  - Skip password (if you apply a password the provider will need it to view the EKG) Click share button (square with upward arrow) in bottom left corner To send: choose MyChart (first time log into MyChart)  Pop up window about sending ECG Click continue Choose type of message Choose provider Type subject and message Click send (EKG should be attached)  - To send additional EKGs in one message click the paperclip image and bottom of page to attach.     Dispo:  No follow-ups on file.   Medication Adjustments/Labs and Tests Ordered: Current medicines are reviewed at length with the patient today.  Concerns regarding medicines are outlined above.  Tests Ordered: No orders of the defined types were placed in this encounter.  Medication Changes: No orders of the defined types were placed in this encounter.

## 2021-05-13 NOTE — Patient Instructions (Signed)
Medication Instructions:  Your physician recommends that you continue on your current medications as directed. Please refer to the Current Medication list given to you today.  *If you need a refill on your cardiac medications before your next appointment, please call your pharmacy*   Lab Work: None If you have labs (blood work) drawn today and your tests are completely normal, you will receive your results only by: MyChart Message (if you have MyChart) OR A paper copy in the mail If you have any lab test that is abnormal or we need to change your treatment, we will call you to review the results.   Testing/Procedures:    Follow-Up: At [facility], you and your health needs are our priority.  As part of our continuing mission to provide you with exceptional heart care, we have created designated Provider Care Teams.  These Care Teams include your primary Cardiologist (physician) and Advanced Practice Providers (APPs -  Physician Assistants and Nurse Practitioners) who all work together to provide you with the care you need, when you need it.  We recommend signing up for the patient portal called "MyChart".  Sign up information is provided on this After Visit Summary.  MyChart is used to connect with patients for Virtual Visits (Telemedicine).  Patients are able to view lab/test results, encounter notes, upcoming appointments, etc.  Non-urgent messages can be sent to your provider as well.   To learn more about what you can do with MyChart, go to [email or web address].    Your next appointment:   As needed  The format for your next appointment:   In Person  Provider:   Thomasene Ripple  [facility] [address], [place], [place] [address]   Other Instructions  KardiaMobile [email or web address]        FDA-cleared, clinical grade mobile EKG monitor: Lourena Simmonds is the most clinically-validated mobile EKG used by the world's leading cardiac care  medical professionals With Basic service, know instantly if your heart rhythm is normal or if atrial fibrillation is detected, and email the last single EKG recording to yourself or your doctor Premium service, available for purchase through the Kardia app for $9.99 per month or $99 per year, includes unlimited history and storage of your EKG recordings, a monthly EKG summary report to share with your doctor, along with the ability to track your blood pressure, activity and weight Includes one KardiaMobile phone clip FREE SHIPPING: Standard delivery 1-3 business days. Orders placed by 11:00am PST will ship that afternoon. Otherwise, will ship next business day. All orders ship via [employer] from [place], [place]    [employer] - sending an EKG [employer] and set up profile. Run EKG - by placing 1-2 fingers on the silver plates After EKG is complete - Download PDF  - Skip password (if you apply a password the provider will need it to view the EKG) Click share button (square with upward arrow) in bottom left corner To send: choose MyChart (first time log into MyChart)  Pop up window about sending ECG Click continue Choose type of message Choose provider Type subject and message Click send (EKG should be attached)  - To send additional EKGs in one message click the paperclip image and bottom of page to attach.

## 2021-05-15 ENCOUNTER — Encounter: Payer: Self-pay | Admitting: Obstetrics and Gynecology

## 2021-05-15 DIAGNOSIS — Z87898 Personal history of other specified conditions: Secondary | ICD-10-CM | POA: Insufficient documentation

## 2021-05-16 ENCOUNTER — Ambulatory Visit: Payer: Medicaid Other

## 2021-05-18 ENCOUNTER — Other Ambulatory Visit: Payer: Self-pay

## 2021-05-18 ENCOUNTER — Ambulatory Visit (INDEPENDENT_AMBULATORY_CARE_PROVIDER_SITE_OTHER): Payer: Medicaid Other | Admitting: Family Medicine

## 2021-05-18 VITALS — BP 121/77 | HR 109 | Wt 178.0 lb

## 2021-05-18 DIAGNOSIS — O09899 Supervision of other high risk pregnancies, unspecified trimester: Secondary | ICD-10-CM

## 2021-05-18 DIAGNOSIS — Z3480 Encounter for supervision of other normal pregnancy, unspecified trimester: Secondary | ICD-10-CM

## 2021-05-18 DIAGNOSIS — Z98891 History of uterine scar from previous surgery: Secondary | ICD-10-CM
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
## 2021-05-18 NOTE — Patient Instructions (Signed)

## 2021-05-20 LAB — AFP, SERUM, OPEN SPINA BIFIDA
AFP MoM: 1.02
AFP Value: 41.8 ng/mL
Gest. Age on Collection Date: 18 weeks
Maternal Age At EDD: 22.9 yr
OSBR Risk 1 IN: 10000
Test Results:: NEGATIVE
Weight: 178 [lb_av]

## 2021-05-25 ENCOUNTER — Other Ambulatory Visit: Payer: Self-pay | Admitting: *Deleted

## 2021-05-25 ENCOUNTER — Other Ambulatory Visit: Payer: Self-pay

## 2021-05-25 ENCOUNTER — Other Ambulatory Visit: Payer: Self-pay | Admitting: Obstetrics and Gynecology

## 2021-05-25 ENCOUNTER — Ambulatory Visit: Payer: Medicaid Other | Attending: Obstetrics and Gynecology

## 2021-05-25 DIAGNOSIS — Z363 Encounter for antenatal screening for malformations: Secondary | ICD-10-CM | POA: Insufficient documentation

## 2021-05-25 DIAGNOSIS — O34219 Maternal care for unspecified type scar from previous cesarean delivery: Secondary | ICD-10-CM | POA: Insufficient documentation

## 2021-05-25 DIAGNOSIS — Z3480 Encounter for supervision of other normal pregnancy, unspecified trimester: Secondary | ICD-10-CM

## 2021-05-25 DIAGNOSIS — Z3A19 19 weeks gestation of pregnancy: Secondary | ICD-10-CM | POA: Insufficient documentation

## 2021-05-25 DIAGNOSIS — O99212 Obesity complicating pregnancy, second trimester: Secondary | ICD-10-CM | POA: Diagnosis not present

## 2021-05-25 DIAGNOSIS — R638 Other symptoms and signs concerning food and fluid intake: Secondary | ICD-10-CM

## 2021-06-14 ENCOUNTER — Other Ambulatory Visit: Payer: Self-pay

## 2021-06-14 ENCOUNTER — Ambulatory Visit (INDEPENDENT_AMBULATORY_CARE_PROVIDER_SITE_OTHER): Payer: Medicaid Other | Admitting: Obstetrics and Gynecology

## 2021-06-14 VITALS — BP 119/78 | HR 90 | Wt 180.0 lb

## 2021-06-14 DIAGNOSIS — Z3A21 21 weeks gestation of pregnancy: Secondary | ICD-10-CM

## 2021-06-14 DIAGNOSIS — O99210 Obesity complicating pregnancy, unspecified trimester: Secondary | ICD-10-CM

## 2021-06-14 DIAGNOSIS — Z98891 History of uterine scar from previous surgery: Secondary | ICD-10-CM
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
## 2021-06-22 ENCOUNTER — Ambulatory Visit: Payer: Medicaid Other | Admitting: *Deleted

## 2021-06-22 ENCOUNTER — Other Ambulatory Visit: Payer: Self-pay | Admitting: *Deleted

## 2021-06-22 ENCOUNTER — Other Ambulatory Visit: Payer: Self-pay

## 2021-06-22 ENCOUNTER — Ambulatory Visit: Payer: Medicaid Other | Attending: Obstetrics and Gynecology

## 2021-06-22 VITALS — BP 121/68 | HR 91

## 2021-06-22 DIAGNOSIS — O34219 Maternal care for unspecified type scar from previous cesarean delivery: Secondary | ICD-10-CM | POA: Diagnosis not present

## 2021-06-22 DIAGNOSIS — O99212 Obesity complicating pregnancy, second trimester: Secondary | ICD-10-CM | POA: Diagnosis not present

## 2021-06-22 DIAGNOSIS — O09892 Supervision of other high risk pregnancies, second trimester: Secondary | ICD-10-CM | POA: Insufficient documentation

## 2021-06-22 DIAGNOSIS — E669 Obesity, unspecified: Secondary | ICD-10-CM | POA: Diagnosis not present

## 2021-06-22 DIAGNOSIS — Z3A23 23 weeks gestation of pregnancy: Secondary | ICD-10-CM | POA: Diagnosis not present

## 2021-06-22 DIAGNOSIS — R638 Other symptoms and signs concerning food and fluid intake: Secondary | ICD-10-CM | POA: Diagnosis not present

## 2021-07-08 ENCOUNTER — Encounter: Payer: Self-pay | Admitting: Family Medicine

## 2021-07-13 ENCOUNTER — Encounter: Payer: Self-pay | Admitting: Family Medicine

## 2021-07-13 ENCOUNTER — Encounter: Payer: Self-pay | Admitting: Obstetrics and Gynecology

## 2021-07-13 ENCOUNTER — Other Ambulatory Visit: Payer: Self-pay

## 2021-07-13 ENCOUNTER — Telehealth (INDEPENDENT_AMBULATORY_CARE_PROVIDER_SITE_OTHER): Payer: Medicaid Other | Admitting: Family Medicine

## 2021-07-13 DIAGNOSIS — O34219 Maternal care for unspecified type scar from previous cesarean delivery: Secondary | ICD-10-CM

## 2021-07-13 DIAGNOSIS — Z3A26 26 weeks gestation of pregnancy: Secondary | ICD-10-CM

## 2021-07-13 DIAGNOSIS — Z98891 History of uterine scar from previous surgery: Secondary | ICD-10-CM

## 2021-07-13 DIAGNOSIS — Z3480 Encounter for supervision of other normal pregnancy, unspecified trimester: Secondary | ICD-10-CM

## 2021-07-13 DIAGNOSIS — O09899 Supervision of other high risk pregnancies, unspecified trimester: Secondary | ICD-10-CM

## 2021-07-13 NOTE — Progress Notes (Signed)
I connected with  Rhonda Massey on 07/13/21 at 11:15 AM EST by telephone and verified that I am speaking with the correct person using two identifiers.   I discussed the limitations, risks, security and privacy concerns of performing an evaluation and management service by telephone and the availability of in person appointments. I also discussed with the patient that there may be a patient responsible charge related to this service. The patient expressed understanding and agreed to proceed.  Scheryl Marten, RN 07/13/2021  11:10 AM

## 2021-07-13 NOTE — Patient Instructions (Signed)
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
## 2021-07-18 ENCOUNTER — Encounter: Payer: Self-pay | Admitting: *Deleted

## 2021-07-18 ENCOUNTER — Other Ambulatory Visit: Payer: Self-pay

## 2021-07-18 ENCOUNTER — Other Ambulatory Visit: Payer: Medicaid Other

## 2021-07-18 DIAGNOSIS — Z3480 Encounter for supervision of other normal pregnancy, unspecified trimester: Secondary | ICD-10-CM | POA: Diagnosis not present

## 2021-07-19 LAB — CBC
Hematocrit: 37.2 % (ref 34.0–46.6)
Hemoglobin: 13 g/dL (ref 11.1–15.9)
MCH: 29.4 pg (ref 26.6–33.0)
MCHC: 34.9 g/dL (ref 31.5–35.7)
MCV: 84 fL (ref 79–97)
Platelets: 263 10*3/uL (ref 150–450)
RBC: 4.42 x10E6/uL (ref 3.77–5.28)
RDW: 12.2 % (ref 11.7–15.4)
WBC: 6.2 10*3/uL (ref 3.4–10.8)

## 2021-07-19 LAB — GLUCOSE TOLERANCE, 2 HOURS W/ 1HR
Glucose, 1 hour: 137 mg/dL (ref 70–179)
Glucose, 2 hour: 98 mg/dL (ref 70–152)
Glucose, Fasting: 85 mg/dL (ref 70–91)

## 2021-07-19 LAB — HIV ANTIBODY (ROUTINE TESTING W REFLEX): HIV Screen 4th Generation wRfx: NONREACTIVE

## 2021-07-19 LAB — RPR: RPR Ser Ql: NONREACTIVE

## 2021-07-22 ENCOUNTER — Inpatient Hospital Stay (HOSPITAL_COMMUNITY)
Admission: AD | Admit: 2021-07-22 | Discharge: 2021-07-22 | Disposition: A | Discharge status: Home / Self Care | Payer: Medicaid Other | Attending: Obstetrics & Gynecology | Admitting: Obstetrics & Gynecology

## 2021-07-22 ENCOUNTER — Inpatient Hospital Stay (HOSPITAL_BASED_OUTPATIENT_CLINIC_OR_DEPARTMENT_OTHER): Payer: Medicaid Other

## 2021-07-22 ENCOUNTER — Other Ambulatory Visit: Payer: Self-pay

## 2021-07-22 ENCOUNTER — Encounter (HOSPITAL_COMMUNITY): Payer: Self-pay | Admitting: Obstetrics & Gynecology

## 2021-07-22 DIAGNOSIS — O4693 Antepartum hemorrhage, unspecified, third trimester: Secondary | ICD-10-CM | POA: Insufficient documentation

## 2021-07-22 DIAGNOSIS — Z6790 Unspecified blood type, Rh positive: Secondary | ICD-10-CM | POA: Insufficient documentation

## 2021-07-22 DIAGNOSIS — O26893 Other specified pregnancy related conditions, third trimester: Secondary | ICD-10-CM | POA: Insufficient documentation

## 2021-07-22 DIAGNOSIS — Z8616 Personal history of COVID-19: Secondary | ICD-10-CM | POA: Diagnosis not present

## 2021-07-22 DIAGNOSIS — Z3689 Encounter for other specified antenatal screening: Secondary | ICD-10-CM | POA: Diagnosis not present

## 2021-07-22 DIAGNOSIS — R1010 Upper abdominal pain, unspecified: Secondary | ICD-10-CM | POA: Insufficient documentation

## 2021-07-22 DIAGNOSIS — Z3A28 28 weeks gestation of pregnancy: Secondary | ICD-10-CM | POA: Insufficient documentation

## 2021-07-22 DIAGNOSIS — R030 Elevated blood-pressure reading, without diagnosis of hypertension: Secondary | ICD-10-CM | POA: Insufficient documentation

## 2021-07-22 DIAGNOSIS — O4690 Antepartum hemorrhage, unspecified, unspecified trimester: Secondary | ICD-10-CM

## 2021-07-22 LAB — COMPREHENSIVE METABOLIC PANEL
ALT: 16 U/L (ref 0–44)
AST: 15 U/L (ref 15–41)
Albumin: 2.9 g/dL — ABNORMAL LOW (ref 3.5–5.0)
Alkaline Phosphatase: 82 U/L (ref 38–126)
Anion gap: 7 (ref 5–15)
BUN: <5 mg/dL — ABNORMAL LOW (ref 6–20)
CO2: 22 mmol/L (ref 22–32)
Calcium: 8.9 mg/dL (ref 8.9–10.3)
Chloride: 106 mmol/L (ref 98–111)
Creatinine, Ser: 0.54 mg/dL (ref 0.44–1.00)
GFR, Estimated: >60 mL/min (ref >60)
Glucose, Bld: 95 mg/dL (ref 70–99)
Potassium: 4.6 mmol/L (ref 3.5–5.1)
Sodium: 135 mmol/L (ref 135–145)
Total Bilirubin: 0.2 mg/dL — ABNORMAL LOW (ref 0.3–1.2)
Total Protein: 6.2 g/dL — ABNORMAL LOW (ref 6.5–8.1)

## 2021-07-22 LAB — CBC WITH DIFFERENTIAL/PLATELET
Abs Immature Granulocytes: 0.04 10*3/uL (ref 0.00–0.07)
Basophils Absolute: 0 10*3/uL (ref 0.0–0.1)
Basophils Relative: 0 %
Eosinophils Absolute: 0 10*3/uL (ref 0.0–0.5)
Eosinophils Relative: 0 %
HCT: 36 % (ref 36.0–46.0)
Hemoglobin: 12.1 g/dL (ref 12.0–15.0)
Immature Granulocytes: 0 %
Lymphocytes Relative: 25 %
Lymphs Abs: 2.3 10*3/uL (ref 0.7–4.0)
MCH: 28.7 pg (ref 26.0–34.0)
MCHC: 33.6 g/dL (ref 30.0–36.0)
MCV: 85.3 fL (ref 80.0–100.0)
Monocytes Absolute: 0.6 10*3/uL (ref 0.1–1.0)
Monocytes Relative: 6 %
Neutro Abs: 6.3 10*3/uL (ref 1.7–7.7)
Neutrophils Relative %: 69 %
Platelets: 279 10*3/uL (ref 150–400)
RBC: 4.22 MIL/uL (ref 3.87–5.11)
RDW: 12.2 % (ref 11.5–15.5)
WBC: 9.3 10*3/uL (ref 4.0–10.5)
nRBC: 0 % (ref 0.0–0.2)

## 2021-07-22 NOTE — MAU Provider Note (Signed)
History     CSN: [id number]  Arrival date and time: 07/22/21 7564   Event Date/Time   First Provider Initiated Contact with Patient 07/22/21 [phone]      Chief Complaint  Patient presents with   Vaginal Bleeding   Abdominal Pain   [name] is a 21 y.o. [phone] at [redacted]w[redacted]d who presents to MAU for vaginal bleeding which began overnight. Patient states she woke up this morning in a pool of blood on the bed that was so significant she had to change her sheets and before she knew it was blood she felt so wet that she thought her water may have broken. Patient states she was having a copious amount of bright red blood for about one hour. Patient reports when she used the bathroom in MAU that she only saw some bleeding on the toilet paper. Patient states she is not currently wearing a pad and did not notice anything in her underwear. Patient denies passing blood clots. Patient denies other episodes of vaginal bleeding except for one instance early in the pregnancy in her first trimester. Patient reports cramping since last night and a sharp pain in her upper abdomen this morning, but denies those symptoms at this time. Patient denies the passage of anything other than blood. Patient denies recent trauma and hx of abruption. Patient denies smoking or drug use. Patient does report high blood pressure with her home cuff, but states she has not had it checked for fit by the office, only by her grandmother who is a Engineer, civil (consulting). Patient states BP was 135/80 at home.  Current pregnancy problems? Hx C/S x1 Blood Type? A Positive Allergies? NKDA Current medications? PNVs Current [facility] & next appt? [place], early February 2022   OB History     Gravida  5   Para  1   Term  1   Preterm  0   AB  3   Living  1      SAB  3   IAB  0   Ectopic  0   Multiple  0   Live Births  1           Past Medical History:  Diagnosis Date   COVID-19 virus infection 07/16/2020   Symptoms started  on 1/10; patient reports much improved on 1/14   Depression Knee Dysplasia    Past Surgical History:  Procedure Laterality Date   APPENDECTOMY  2018   CESAREAN SECTION N/A 08/21/2020   Procedure: CESAREAN SECTION;  Surgeon: Mitchel Honour, DO;  Location: [facility];  Service: Obstetrics;  Laterality: N/A;   KNEE SURGERY     LAPAROSCOPIC APPENDECTOMY N/A 03/02/2017   Procedure: APPENDECTOMY LAPAROSCOPIC;  Surgeon: Jimmye Norman, MD;  Location: [facility] OR;  Service: General;  Laterality: N/A;   WISDOM TOOTH EXTRACTION      Family History  Problem Relation Age of Onset   Cancer Maternal Grandmother        breast    Social History   Tobacco Use   Smoking status: Never   Smokeless tobacco: Never  Vaping Use   Vaping Use: Never used  Substance Use Topics   Alcohol use: Never   Drug use: Never    Allergies:  Allergies  Allergen Reactions   Ibuprofen Nausea And Vomiting    Medications Prior to Admission  Medication Sig Dispense Refill Last Dose   Prenatal Vit-Fe Fumarate-FA (PRENATAL MULTIVITAMIN) TABS tablet Take 1 tablet by mouth daily at 12 noon.  Review of Systems  Constitutional:  Negative for chills, diaphoresis, fatigue and fever.  Eyes:  Negative for visual disturbance.  Respiratory:  Negative for shortness of breath.   Cardiovascular:  Negative for chest pain.  Gastrointestinal:  Positive for abdominal pain ((not present at this time)). Negative for constipation, diarrhea, nausea and vomiting.  Genitourinary:  Positive for vaginal bleeding. Negative for dysuria, flank pain, frequency, pelvic pain, urgency and vaginal discharge.  Neurological:  Negative for dizziness, weakness, light-headedness and headaches.   Physical Exam   Blood pressure 128/75, pulse 93, temperature 98 F (36.7 C), temperature source Oral, resp. rate 19, height 5' (1.524 m), weight 82.6 kg, last menstrual period 12/03/2020, SpO2 100 %, unknown if currently breastfeeding.  Patient Vitals for  the past 24 hrs:  BP Temp Temp src Pulse Resp SpO2 Height Weight  07/22/21 0950 128/75 -- -- 93 -- -- -- --  07/22/21 0815 123/69 -- -- 95 -- -- -- --  07/22/21 0753 121/66 98 F (36.7 C) Oral (!) 105 19 100 % -- --  07/22/21 0746 -- -- -- -- -- -- 5' (1.524 m) 82.6 kg   Physical Exam Vitals and nursing note reviewed. Exam conducted with a chaperone present.  Constitutional:      General: She is not in acute distress.    Appearance: Normal appearance. She is not ill-appearing, toxic-appearing or diaphoretic.  HENT:     Head: Normocephalic and atraumatic.  Pulmonary:     Effort: Pulmonary effort is normal.  Genitourinary:    General: Normal vulva.     Labia:        Right: No rash, tenderness, lesion or injury.        Left: No rash, tenderness, lesion or injury.      Vagina: No signs of injury. No vaginal discharge, tenderness, bleeding or lesions.     Cervix: Friability present. No lesion, erythema or cervical bleeding.     Comments: Cervix appears visually closed on speculum exam. No evidence of past or active bleeding. Cervix with mucus present on exterior, wiped away with Fox swab, friable with blood-tinged mucus as a result. No blood present in vagina. Skin:    General: Skin is warm and dry.  Neurological:     Mental Status: She is alert and oriented to person, place, and time.  Psychiatric:        Mood and Affect: Mood normal.        Behavior: Behavior normal.        Thought Content: Thought content normal.        Judgment: Judgment normal.   Results for orders placed or performed during the hospital encounter of 07/22/21 (from the past 24 hour(s))  CBC with Differential/Platelet     Status: None   Collection Time: 07/22/21  8:45 AM  Result Value Ref Range   WBC 9.3 4.0 - 10.5 K/uL   RBC 4.22 3.87 - 5.11 MIL/uL   Hemoglobin 12.1 12.0 - 15.0 g/dL   HCT 36.0 36.0 - 46.0 %   MCV 85.3 80.0 - 100.0 fL   MCH 28.7 26.0 - 34.0 pg   MCHC 33.6 30.0 - 36.0 g/dL   RDW 12.2 11.5  - 15.5 %   Platelets 279 150 - 400 K/uL   nRBC 0.0 0.0 - 0.2 %   Neutrophils Relative % 69 %   Neutro Abs 6.3 1.7 - 7.7 K/uL   Lymphocytes Relative 25 %   Lymphs Abs 2.3 0.7 - 4.0 K/uL  Monocytes Relative 6 %   Monocytes Absolute 0.6 0.1 - 1.0 K/uL   Eosinophils Relative 0 %   Eosinophils Absolute 0.0 0.0 - 0.5 K/uL   Basophils Relative 0 %   Basophils Absolute 0.0 0.0 - 0.1 K/uL   Immature Granulocytes 0 %   Abs Immature Granulocytes 0.04 0.00 - 0.07 K/uL  Comprehensive metabolic panel     Status: Abnormal   Collection Time: 07/22/21  8:45 AM  Result Value Ref Range   Sodium 135 135 - 145 mmol/L   Potassium 4.6 3.5 - 5.1 mmol/L   Chloride 106 98 - 111 mmol/L   CO2 22 22 - 32 mmol/L   Glucose, Bld 95 70 - 99 mg/dL   BUN <5 (L) 6 - 20 mg/dL   Creatinine, Ser 0.54 0.44 - 1.00 mg/dL   Calcium 8.9 8.9 - 10.3 mg/dL   Total Protein 6.2 (L) 6.5 - 8.1 g/dL   Albumin 2.9 (L) 3.5 - 5.0 g/dL   AST 15 15 - 41 U/L   ALT 16 0 - 44 U/L   Alkaline Phosphatase 82 38 - 126 U/L   Total Bilirubin 0.2 (L) 0.3 - 1.2 mg/dL   GFR, Estimated >60 >60 mL/min   Anion gap 7 5 - 15    [place] MFM OB FOLLOW UP  Result Date: 06/23/2021 ----------------------------------------------------------------------  OBSTETRICS REPORT                    (Corrected Final 06/23/2021 05:02 pm) ---------------------------------------------------------------------- Patient Info  ID #:       [id number]                          D.O.B.:  03-31-2001 (20 yrs)  Name:       Jacob Moores                   Visit Date: 06/22/2021 08:24 am              [place] ---------------------------------------------------------------------- Performed By  Attending:        Sander Nephew      Ref. Address:     [place], [place]  Performed By:     Rodrigo Ran BS      [facility]                                 [facility]                                                             [facility]                                                             [facility]  Referred By:      Aletha Halim MD ---------------------------------------------------------------------- Orders  #  Description                           Code        Ordered By  1  [place] MFM OB FOLLOW UP                   [id number]    [place] ----------------------------------------------------------------------  #  Order #                     Accession #                Episode #  1  [id number]                   [id number]                 [id number] ---------------------------------------------------------------------- Indications  [redacted] weeks gestation of pregnancy                Z3A.23  Antenatal follow-up for nonvisualized fetal    Z36.2  anatomy  LR NIPS, Neg Horizon; neg AFP  History of cesarean delivery, currently        O34.219  pregnant (x1)  Obesity complicating pregnancy, second         O99.212  trimester (BMI 35)  Short interval between pregancies, 2nd         O09.892  trimester ---------------------------------------------------------------------- Fetal Evaluation  Num Of Fetuses:         1  Fetal Heart Rate(bpm):  138  Cardiac Activity:       Observed  Presentation:           Cephalic  Placenta:               Posterior  P. Cord Insertion:      Visualized  Amniotic Fluid  AFI FV:      Subjectively upper-normal                              Largest Pocket(cm)  8.6 ---------------------------------------------------------------------- Biometry  BPD:      60.4  mm     G. Age:  24w 4d         71  %    CI:        71.15   %    70 - 86                                                          FL/HC:      18.3   %    18.7 - 20.9  HC:      228.1  mm     G. Age:  24w 6d          70  %    HC/AC:      1.16        1.05 - 1.21  AC:      196.2  mm     G. Age:  24w 2d         56  %    FL/BPD:     69.0   %    71 - 87  FL:       41.7  mm     G. Age:  23w 4d         28  %    FL/AC:      21.3   %    20 - 24  CER:      27.4  mm     G. Age:  24w 3d         80  %  LV:        4.8  mm  Est. FW:     660  gm      1 lb 7 oz     53  % ---------------------------------------------------------------------- OB History  Gravidity:    5         Term:   1        Prem:   0        SAB:   3  TOP:          0       Ectopic:  0        Living: 1 ---------------------------------------------------------------------- Gestational Age  LMP:           28w 5d        Date:  12/03/20                 EDD:   09/09/21  U/S Today:     24w 2d                                        EDD:   10/10/21  Best:          23w 6d     Det. ByLoman Chroman         EDD:   10/13/21                                      (02/22/21) ---------------------------------------------------------------------- Anatomy  Cranium:  Appears normal         LVOT:                   Appears normal  Cavum:                 Appears normal         Aortic Arch:            Appears normal  Ventricles:            Appears normal         Ductal Arch:            Previously seen  Choroid Plexus:        Appears normal         Diaphragm:              Previously seen  Cerebellum:            Appears normal         Stomach:                Appears normal, left                                                                        sided  Posterior Fossa:       Appears normal         Abdomen:                Appears normal  Nuchal Fold:           Previously seen        Abdominal Wall:         Appears nml (cord                                                                        insert, abd wall)  Face:                  Appears normal         Cord Vessels:           Appears normal (3                         (orbits and profile)                           vessel cord)   Lips:                  Appears normal         Kidneys:                Appear normal  Palate:                Appears normal         Bladder:                Appears normal  Thoracic:              Appears normal         Spine:                  Appears normal  Heart:                 Appears normal         Upper Extremities:      Previously seen                         (4CH, axis, and                         situs)  RVOT:                  Appears normal         Lower Extremities:      Previously seen  Other:  Heels/feet and open hands/5th digits visualized. 3VV, 3VTV and VC          visualized. Lenses, maxilla, mandible and nasal bone visualized. ---------------------------------------------------------------------- Cervix Uterus Adnexa  Cervix  Length:              4  cm.  Normal appearance by transabdominal scan.  Uterus  No abnormality visualized.  Right Ovary  Not visualized.  Left Ovary  Not visualized.  Cul De Sac  No free fluid seen.  Adnexa  No abnormality visualized. ---------------------------------------------------------------------- Impression  Follow up growth due to elevated BMI and to complete the  fetal anatomy.  Normal interval growth with measurements consistent with  dates  Good fetal movement and amniotic fluid volume  Anatomy now completed  No evidence of low lying placenta ---------------------------------------------------------------------- Recommendations  Follow up growth as clinically indicated. ----------------------------------------------------------------------                    Sander Nephew, MD Electronically Signed Corrected Final Report  06/23/2021 05:02 pm ----------------------------------------------------------------------   MAU Course  Procedures  MDM -pt with report of copious vaginal bleeding this morning -no evidence of bleeding on exam, minus cervical friability -ABO: A Positive -given pt report of copious bleeding, will perform blood work and [place] -CBC:  WNL -CMP: no abnormalities requiring treatment -[place]: WNL -EFM: reactive       -baseline: 135       -variability: moderate       -accels: present. 15x15       -decels: absent       -TOCO: quiet -pt discharged to home in stable condition  Orders Placed This Encounter  Procedures   [place] MFM OB LIMITED    Standing Status:   Standing    Number of Occurrences:   1    Order Specific Question:   Symptom/Reason for Exam    Answer:   Vaginal bleeding in pregnancy [id number]   CBC with Differential/Platelet    Standing Status:   Standing    Number of Occurrences:   1   Comprehensive metabolic panel    Standing Status:   Standing    Number of Occurrences:   1   Discharge patient    Order Specific Question:   Discharge disposition    Answer:   01-Home or Self Care [1]    Order Specific Question:   Discharge patient date    Answer:   07/22/2021   No orders of the defined types  were placed in this encounter.  Assessment and Plan   1. Vaginal bleeding in pregnancy, third trimester   2. Vaginal bleeding in pregnancy   3. [redacted] weeks gestation of pregnancy   4. Blood type, Rh positive   5. NST (non-stress test) reactive     Allergies as of 07/22/2021       Reactions   Ibuprofen Nausea And Vomiting        Medication List     TAKE these medications    prenatal multivitamin Tabs tablet Take 1 tablet by mouth daily at 12 noon.        -return MAU precautions given -pt advised to take photos should bleeding occur again -pt discharged to home in stable condition  Elmyra Ricks E Rielynn Trulson 07/22/2021, 9:51 AM

## 2021-07-22 NOTE — MAU Note (Signed)
Present stating having abdominal cramping, sharp intermittent upper right abdominal pain, and VB.  Reports VB was bright red and was a moderate amount, no clots seen.  Last intercourse 2 days ago.  Endorses +FM, less than usual.  States vomited once this morning, but that too is unusual.

## 2021-08-09 ENCOUNTER — Ambulatory Visit (INDEPENDENT_AMBULATORY_CARE_PROVIDER_SITE_OTHER): Payer: Medicaid Other | Admitting: Obstetrics and Gynecology

## 2021-08-09 ENCOUNTER — Other Ambulatory Visit: Payer: Self-pay

## 2021-08-09 VITALS — BP 106/75 | HR 87 | Wt 182.0 lb

## 2021-08-09 DIAGNOSIS — Z23 Encounter for immunization: Secondary | ICD-10-CM

## 2021-08-09 DIAGNOSIS — Z3483 Encounter for supervision of other normal pregnancy, third trimester: Secondary | ICD-10-CM

## 2021-08-09 DIAGNOSIS — Z3A30 30 weeks gestation of pregnancy: Secondary | ICD-10-CM

## 2021-08-09 DIAGNOSIS — Z3480 Encounter for supervision of other normal pregnancy, unspecified trimester: Secondary | ICD-10-CM
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
## 2021-08-11 ENCOUNTER — Telehealth: Payer: Self-pay | Admitting: Obstetrics and Gynecology

## 2021-08-11 NOTE — Telephone Encounter (Signed)
Patient is calling is having a hard time with her acid reflux and it's making her nauseous even when she is walking around. Tums and Mylanta isn't helping. She would like to know what else she can take.   CB# [phone]

## 2021-08-15 ENCOUNTER — Encounter: Payer: Self-pay | Admitting: Obstetrics and Gynecology

## 2021-08-19 ENCOUNTER — Encounter: Payer: Self-pay | Admitting: Obstetrics and Gynecology

## 2021-08-22 ENCOUNTER — Encounter: Payer: Self-pay | Admitting: Emergency Medicine

## 2021-08-22 ENCOUNTER — Ambulatory Visit
Admission: EM | Admit: 2021-08-22 | Discharge: 2021-08-22 | Disposition: A | Discharge status: Home / Self Care | Payer: Medicaid Other | Attending: Internal Medicine | Admitting: Internal Medicine

## 2021-08-22 ENCOUNTER — Other Ambulatory Visit: Payer: Self-pay

## 2021-08-22 DIAGNOSIS — J029 Acute pharyngitis, unspecified: Secondary | ICD-10-CM | POA: Diagnosis not present

## 2021-08-22 DIAGNOSIS — J069 Acute upper respiratory infection, unspecified: Secondary | ICD-10-CM | POA: Diagnosis not present

## 2021-08-22 LAB — POCT RAPID STREP A (OFFICE): Rapid Strep A Screen: NEGATIVE

## 2021-08-22 NOTE — Discharge Instructions (Signed)
Rapid strep test is negative.  Throat culture and COVID test is pending.  We will call if it is positive.  It appears that you have a viral upper respiratory infection that should self resolve in the next few days with symptomatic treatment.

## 2021-08-22 NOTE — ED Triage Notes (Signed)
Sore throat starting two days ago. Requesting strep rule out
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
## 2021-08-23 LAB — COVID-19, FLU A+B AND RSV
Influenza A, NAA: NOT DETECTED
Influenza B, NAA: NOT DETECTED
RSV, NAA: NOT DETECTED
SARS-CoV-2, NAA: NOT DETECTED

## 2021-08-23 NOTE — Patient Instructions (Signed)
Rhonda Massey ? 08/23/2021 ? ? Your procedure is scheduled on:  09/05/2021 ? Arrive at 1030 at [employer] on [address] at [facility]  and [employer]. You are invited to use the FREE valet parking or use the Visitor's parking deck. ? Pick up the phone at the desk and dial [phone]. ? Call this number if you have problems the morning of surgery: [phone] ? Remember: ? ? Do not eat food:(After Midnight) Desp?s de medianoche. ? Do not drink clear liquids: (After Midnight) Desp?s de medianoche. ? Take these medicines the morning of surgery with A SIP OF WATER:  none ? ? Do not wear jewelry, make-up or nail polish. ? Do not wear lotions, powders, or perfumes. Do not wear deodorant. ? Do not shave 48 hours prior to surgery. ? Do not bring valuables to the hospital.  [facility] is not  ? responsible for any belongings or valuables brought to the hospital. ? Contacts, dentures or bridgework may not be worn into surgery. ? Leave suitcase in the car. After surgery it may be brought to your room. ? For patients admitted to the hospital, checkout time is 11:00 AM the day of  ?            discharge. ? ?   ? Please read over the following fact sheets that you were given:  ?   Preparing for Surgery ? ? ?

## 2021-08-24 ENCOUNTER — Telehealth: Payer: Self-pay

## 2021-08-24 ENCOUNTER — Encounter: Payer: Medicaid Other | Admitting: Family Medicine

## 2021-08-24 ENCOUNTER — Encounter (HOSPITAL_COMMUNITY): Payer: Self-pay

## 2021-08-24 ENCOUNTER — Ambulatory Visit: Payer: Medicaid Other | Admitting: *Deleted

## 2021-08-24 ENCOUNTER — Ambulatory Visit: Payer: Medicaid Other | Attending: Obstetrics and Gynecology

## 2021-08-24 ENCOUNTER — Encounter: Payer: Self-pay | Admitting: *Deleted

## 2021-08-24 ENCOUNTER — Other Ambulatory Visit: Payer: Self-pay

## 2021-08-24 ENCOUNTER — Encounter: Payer: Self-pay | Admitting: Family Medicine

## 2021-08-24 VITALS — BP 123/70 | HR 95

## 2021-08-24 DIAGNOSIS — O34219 Maternal care for unspecified type scar from previous cesarean delivery: Secondary | ICD-10-CM | POA: Diagnosis present

## 2021-08-24 DIAGNOSIS — O99212 Obesity complicating pregnancy, second trimester: Secondary | ICD-10-CM | POA: Diagnosis not present

## 2021-08-24 DIAGNOSIS — Z3A32 32 weeks gestation of pregnancy: Secondary | ICD-10-CM

## 2021-08-24 DIAGNOSIS — Z362 Encounter for other antenatal screening follow-up: Secondary | ICD-10-CM | POA: Diagnosis not present

## 2021-08-24 LAB — CULTURE, GROUP A STREP (THRC)

## 2021-08-24 NOTE — Telephone Encounter (Signed)
Lmom for pt to call office about appt and offer a virtual appt to prevent no show

## 2021-08-24 NOTE — Progress Notes (Signed)
Patient did not keep appointment today. She will be called to reschedule.  

## 2021-09-02 ENCOUNTER — Other Ambulatory Visit (HOSPITAL_COMMUNITY)
Admission: RE | Admit: 2021-09-02 | Discharge: 2021-09-02 | Disposition: A | Discharge status: Home / Self Care | Payer: Medicaid Other | Source: Ambulatory Visit | Attending: Obstetrics and Gynecology | Admitting: Obstetrics and Gynecology

## 2021-09-02 ENCOUNTER — Other Ambulatory Visit: Payer: Self-pay

## 2021-09-02 ENCOUNTER — Inpatient Hospital Stay (HOSPITAL_COMMUNITY)
Admission: AD | Admit: 2021-09-02 | Discharge: 2021-09-02 | Disposition: A | Discharge status: Home / Self Care | Payer: Medicaid Other | Attending: Obstetrics & Gynecology | Admitting: Obstetrics & Gynecology

## 2021-09-02 ENCOUNTER — Encounter (HOSPITAL_COMMUNITY): Payer: Self-pay | Admitting: Obstetrics & Gynecology

## 2021-09-02 DIAGNOSIS — Z20822 Contact with and (suspected) exposure to covid-19: Secondary | ICD-10-CM | POA: Diagnosis not present

## 2021-09-02 DIAGNOSIS — O26893 Other specified pregnancy related conditions, third trimester: Secondary | ICD-10-CM | POA: Insufficient documentation

## 2021-09-02 DIAGNOSIS — Z3A34 34 weeks gestation of pregnancy: Secondary | ICD-10-CM | POA: Insufficient documentation

## 2021-09-02 DIAGNOSIS — O212 Late vomiting of pregnancy: Secondary | ICD-10-CM | POA: Diagnosis not present

## 2021-09-02 DIAGNOSIS — Z28310 Unvaccinated for covid-19: Secondary | ICD-10-CM | POA: Insufficient documentation

## 2021-09-02 DIAGNOSIS — R197 Diarrhea, unspecified: Secondary | ICD-10-CM

## 2021-09-02 DIAGNOSIS — O219 Vomiting of pregnancy, unspecified: Secondary | ICD-10-CM | POA: Diagnosis not present

## 2021-09-02 DIAGNOSIS — O26899 Other specified pregnancy related conditions, unspecified trimester: Secondary | ICD-10-CM

## 2021-09-02 DIAGNOSIS — Z3689 Encounter for other specified antenatal screening: Secondary | ICD-10-CM

## 2021-09-02 LAB — COMPREHENSIVE METABOLIC PANEL
ALT: 38 U/L (ref 0–44)
AST: 22 U/L (ref 15–41)
Albumin: 2.6 g/dL — ABNORMAL LOW (ref 3.5–5.0)
Alkaline Phosphatase: 144 U/L — ABNORMAL HIGH (ref 38–126)
Anion gap: 12 (ref 5–15)
BUN: <5 mg/dL — ABNORMAL LOW (ref 6–20)
CO2: 19 mmol/L — ABNORMAL LOW (ref 22–32)
Calcium: 8.7 mg/dL — ABNORMAL LOW (ref 8.9–10.3)
Chloride: 103 mmol/L (ref 98–111)
Creatinine, Ser: 0.48 mg/dL (ref 0.44–1.00)
GFR, Estimated: >60 mL/min (ref >60)
Glucose, Bld: 116 mg/dL — ABNORMAL HIGH (ref 70–99)
Potassium: 4 mmol/L (ref 3.5–5.1)
Sodium: 134 mmol/L — ABNORMAL LOW (ref 135–145)
Total Bilirubin: 0.5 mg/dL (ref 0.3–1.2)
Total Protein: 6.5 g/dL (ref 6.5–8.1)

## 2021-09-02 LAB — WET PREP, GENITAL
Clue Cells Wet Prep HPF POC: NONE SEEN
Sperm: NONE SEEN
Trich, Wet Prep: NONE SEEN
WBC, Wet Prep HPF POC: >=10 — AB (ref <10)
Yeast Wet Prep HPF POC: NONE SEEN

## 2021-09-02 LAB — URINALYSIS, MICROSCOPIC (REFLEX)

## 2021-09-02 LAB — CBC WITH DIFFERENTIAL/PLATELET
Abs Immature Granulocytes: 0.07 10*3/uL (ref 0.00–0.07)
Basophils Absolute: 0 10*3/uL (ref 0.0–0.1)
Basophils Relative: 0 %
Eosinophils Absolute: 0 10*3/uL (ref 0.0–0.5)
Eosinophils Relative: 0 %
HCT: 38.7 % (ref 36.0–46.0)
Hemoglobin: 12.9 g/dL (ref 12.0–15.0)
Immature Granulocytes: 1 %
Lymphocytes Relative: 6 %
Lymphs Abs: 0.6 10*3/uL — ABNORMAL LOW (ref 0.7–4.0)
MCH: 28.1 pg (ref 26.0–34.0)
MCHC: 33.3 g/dL (ref 30.0–36.0)
MCV: 84.3 fL (ref 80.0–100.0)
Monocytes Absolute: 0.5 10*3/uL (ref 0.1–1.0)
Monocytes Relative: 5 %
Neutro Abs: 9 10*3/uL — ABNORMAL HIGH (ref 1.7–7.7)
Neutrophils Relative %: 88 %
Platelets: 298 10*3/uL (ref 150–400)
RBC: 4.59 MIL/uL (ref 3.87–5.11)
RDW: 12.6 % (ref 11.5–15.5)
WBC: 10.1 10*3/uL (ref 4.0–10.5)
nRBC: 0 % (ref 0.0–0.2)

## 2021-09-02 LAB — URINALYSIS, ROUTINE W REFLEX MICROSCOPIC
Bilirubin Urine: NEGATIVE
Glucose, UA: NEGATIVE mg/dL
Hgb urine dipstick: NEGATIVE
Ketones, ur: NEGATIVE mg/dL
Nitrite: POSITIVE — AB
Protein, ur: 30 mg/dL — AB
Specific Gravity, Urine: 1.025 (ref 1.005–1.030)
pH: 6.5 (ref 5.0–8.0)

## 2021-09-02 LAB — RESP PANEL BY RT-PCR (FLU A&B, COVID) ARPGX2
Influenza A by PCR: NEGATIVE
Influenza B by PCR: NEGATIVE
SARS Coronavirus 2 by RT PCR: NEGATIVE

## 2021-09-02 MED ORDER — ONDANSETRON HCL 4 MG/2ML IJ SOLN
4.0000 mg | Freq: Once | INTRAMUSCULAR | Status: AC
  Administered 2021-09-02: 4 mg via INTRAVENOUS
  Filled 2021-09-02: qty 2

## 2021-09-02 MED ORDER — LACTATED RINGERS IV BOLUS
1000.0000 mL | Freq: Once | INTRAVENOUS | Status: AC
Start: 2021-09-02 — End: 2021-09-02
  Administered 2021-09-02: 1000 mL via INTRAVENOUS

## 2021-09-02 MED ORDER — PROMETHAZINE HCL 25 MG PO TABS
25.0000 mg | ORAL_TABLET | Freq: Four times a day (QID) | ORAL | 0 refills | Status: DC | PRN
Start: 2021-09-02 — End: 2021-10-03

## 2021-09-02 NOTE — MAU Provider Note (Signed)
History     CSN: [id number]  Arrival date and time: 09/02/21 0747   None     Chief Complaint  Patient presents with   Emesis   HPI Rhonda Massey is a 21 y.o. [id number] at [redacted]w[redacted]d who presents to MAU with chief complaint of emesis. This is a new problem, onset around 2000 hours last night. She attempted management with Diclegis but vomited immediately after taking the pill. She did not have a COVID vaccine series and did not receive the seasonal Flu vaccine. No one else in the home is ill. No new foods or irritants. She denies fever, abdominal tenderness, dysuria, weakness, SOB, syncope.  Patient also c/o generalized abdominal pain, dizziness and "feeling hot". Onset of all symptoms 2/2 onset of vomiting. Pain score is 7/10. She denies vaginal bleeding, leaking of fluid, decreased fetal movement, fever, falls, or recent illness.   Patient receives care with [facility]. Pregnancy c/b short interval pregnancy, hx primary cesarean for FITL 08/2020.  OB History     Gravida  5   Para  1   Term  1   Preterm  0   AB  3   Living  1      SAB  3   IAB  0   Ectopic  0   Multiple  0   Live Births  1           Past Medical History:  Diagnosis Date   COVID-19 virus infection 07/16/2020   Symptoms started on 1/10; patient reports much improved on 1/14   Depression Knee Dysplasia    Past Surgical History:  Procedure Laterality Date   APPENDECTOMY  2018   CESAREAN SECTION N/A 08/21/2020   Procedure: CESAREAN SECTION;  Surgeon: Mitchel Honour, DO;  Location: [facility] LD [facility]S;  Service: Obstetrics;  Laterality: N/A;   KNEE SURGERY     LAPAROSCOPIC APPENDECTOMY N/A 03/02/2017   Procedure: APPENDECTOMY LAPAROSCOPIC;  Surgeon: Jimmye Norman, MD;  Location: [facility] OR;  Service: General;  Laterality: N/A;   WISDOM TOOTH EXTRACTION      Family History  Problem Relation Age of Onset   Cancer Maternal Grandmother        breast    Social History   Tobacco Use   Smoking  status: Never   Smokeless tobacco: Never  Vaping Use   Vaping Use: Never used  Substance Use Topics   Alcohol use: Never   Drug use: Never    Allergies:  Allergies  Allergen Reactions   Ibuprofen Nausea And Vomiting    Medications Prior to Admission  Medication Sig Dispense Refill Last Dose   Prenatal Vit-Fe Fumarate-FA (PRENATAL MULTIVITAMIN) TABS tablet Take 1 tablet by mouth daily at 12 noon.       Review of Systems  Constitutional:  Positive for fatigue.  Gastrointestinal:  Positive for nausea and vomiting.  Neurological:  Positive for dizziness.  All other systems reviewed and are negative. Physical Exam   Blood pressure 124/71, pulse (!) 143, temperature 99.1 F (37.3 C), resp. rate 18, height 5' (1.524 m), weight 84.8 kg, last menstrual period 12/03/2020, unknown if currently breastfeeding.  Physical Exam Vitals and nursing note reviewed. Exam conducted with a chaperone present.  Constitutional:      Appearance: She is ill-appearing.  Cardiovascular:     Rate and Rhythm: Tachycardia present.     Pulses: Normal pulses.     Heart sounds: Normal heart sounds.  Pulmonary:     Effort: Pulmonary effort  is normal.     Breath sounds: Normal breath sounds.  Abdominal:     Tenderness: There is no abdominal tenderness. There is no right CVA tenderness, left CVA tenderness or guarding.     Comments: Gravid  Genitourinary:    Comments: Pelvic exam: External genitalia normal, vaginal walls pink and well rugated, cervix visually closed, no lesions noted. Scant white discharge near cervical os. Likely physiologic.Will collect wet prep   Skin:    Capillary Refill: Capillary refill takes less than 2 seconds.  Neurological:     Mental Status: She is oriented to person, place, and time.  Psychiatric:        Mood and Affect: Mood normal.        Behavior: Behavior normal.        Thought Content: Thought content normal.    MAU Course  Procedures: speculum  exam  MDM  --EMR reviewed.  --Reactive tracing: baseline 140, mod var, + accels, no decels --Toco: rare contractions on arrival, resolved with IV fluid bolus --Pertinent negatives: SOB, abnormal lung sounds, fever --Abnormal UA with squamous epi, suggesting contamination, will send culture  Orders Placed This Encounter  Procedures   Resp Panel by RT-PCR (Flu A&B, Covid) Nasopharyngeal Swab   Urinalysis, Routine w reflex microscopic Urine, Clean Catch   CBC with Differential/Platelet   Comprehensive metabolic panel   Contact Isolation: Enteric   Insert peripheral IV   Meds ordered this encounter  Medications   lactated ringers bolus 1,000 mL   ondansetron (ZOFRAN) injection 4 mg   Results for orders placed or performed during the hospital encounter of 09/02/21 (from the past 24 hour(s))  CBC with Differential/Platelet     Status: Abnormal   Collection Time: 09/02/21  8:37 AM  Result Value Ref Range   WBC 10.1 4.0 - 10.5 K/uL   RBC 4.59 3.87 - 5.11 MIL/uL   Hemoglobin 12.9 12.0 - 15.0 g/dL   HCT 91.7 91.5 - 05.6 %   MCV 84.3 80.0 - 100.0 fL   MCH 28.1 26.0 - 34.0 pg   MCHC 33.3 30.0 - 36.0 g/dL   RDW 97.9 48.0 - 16.5 %   Platelets 298 150 - 400 K/uL   nRBC 0.0 0.0 - 0.2 %   Neutrophils Relative % 88 %   Neutro Abs 9.0 (H) 1.7 - 7.7 K/uL   Lymphocytes Relative 6 %   Lymphs Abs 0.6 (L) 0.7 - 4.0 K/uL   Monocytes Relative 5 %   Monocytes Absolute 0.5 0.1 - 1.0 K/uL   Eosinophils Relative 0 %   Eosinophils Absolute 0.0 0.0 - 0.5 K/uL   Basophils Relative 0 %   Basophils Absolute 0.0 0.0 - 0.1 K/uL   Immature Granulocytes 1 %   Abs Immature Granulocytes 0.07 0.00 - 0.07 K/uL  Comprehensive metabolic panel     Status: Abnormal   Collection Time: 09/02/21  8:37 AM  Result Value Ref Range   Sodium 134 (L) 135 - 145 mmol/L   Potassium 4.0 3.5 - 5.1 mmol/L   Chloride 103 98 - 111 mmol/L   CO2 19 (L) 22 - 32 mmol/L   Glucose, Bld 116 (H) 70 - 99 mg/dL   BUN <5 (L) 6 - 20  mg/dL   Creatinine, Ser 5.37 0.44 - 1.00 mg/dL   Calcium 8.7 (L) 8.9 - 10.3 mg/dL   Total Protein 6.5 6.5 - 8.1 g/dL   Albumin 2.6 (L) 3.5 - 5.0 g/dL   AST 22 15 - 41  U/L   ALT 38 0 - 44 U/L   Alkaline Phosphatase 144 (H) 38 - 126 U/L   Total Bilirubin 0.5 0.3 - 1.2 mg/dL   GFR, Estimated >09 >23 mL/min   Anion gap 12 5 - 15  Resp Panel by RT-PCR (Flu A&B, Covid) Nasopharyngeal Swab     Status: None   Collection Time: 09/02/21  8:37 AM   Specimen: Nasopharyngeal Swab; Nasopharyngeal(NP) swabs in vial transport medium  Result Value Ref Range   SARS Coronavirus 2 by RT PCR NEGATIVE NEGATIVE   Influenza A by PCR NEGATIVE NEGATIVE   Influenza B by PCR NEGATIVE NEGATIVE  Wet prep, genital     Status: Abnormal   Collection Time: 09/02/21 10:17 AM   Specimen: Vaginal  Result Value Ref Range   Yeast Wet Prep HPF POC NONE SEEN NONE SEEN   Trich, Wet Prep NONE SEEN NONE SEEN   Clue Cells Wet Prep HPF POC NONE SEEN NONE SEEN   WBC, Wet Prep HPF POC >=10 (A) <10   Sperm NONE SEEN   Urinalysis, Routine w reflex microscopic Urine, Clean Catch     Status: Abnormal   Collection Time: 09/02/21 10:31 AM  Result Value Ref Range   Color, Urine YELLOW YELLOW   APPearance CLEAR CLEAR   Specific Gravity, Urine 1.025 1.005 - 1.030   pH 6.5 5.0 - 8.0   Glucose, UA NEGATIVE NEGATIVE mg/dL   Hgb urine dipstick NEGATIVE NEGATIVE   Bilirubin Urine NEGATIVE NEGATIVE   Ketones, ur NEGATIVE NEGATIVE mg/dL   Protein, ur 30 (A) NEGATIVE mg/dL   Nitrite POSITIVE (A) NEGATIVE   Leukocytes,Ua MODERATE (A) NEGATIVE  Urinalysis, Microscopic (reflex)     Status: Abnormal   Collection Time: 09/02/21 10:31 AM  Result Value Ref Range   RBC / HPF 0-5 0 - 5 RBC/hpf   WBC, UA 11-20 0 - 5 WBC/hpf   Bacteria, UA MANY (A) NONE SEEN   Squamous Epithelial / LPF 6-10 0 - 5   Non Squamous Epithelial PRESENT (A) NONE SEEN   Mucus PRESENT    Urine-Other LESS THAN 10 mL OF URINE SUBMITTED    Meds ordered this encounter   Medications   lactated ringers bolus 1,000 mL   ondansetron (ZOFRAN) injection 4 mg   promethazine (PHENERGAN) 25 MG tablet    Sig: Take 1 tablet (25 mg total) by mouth every 6 (six) hours as needed for nausea or vomiting.    Dispense:  30 tablet    Refill:  0    Order Specific Question:   Supervising Provider    Answer:   Myna Hidalgo [[id number]]   Assessment and Plan  --21 y.o. G5P1031 at [redacted]w[redacted]d  --Acute onset vomiting, potential GI virus --Reactive tracing --Closed cervix --Negative COVID and Flu --No episodes of vomiting or diarrhea in MAU --Tolerating PO solid and liquid prior to discharge --Urine culture in work --Discharge home in stable condition  F/U: --Next appointment at [facility] is 09/07/2021  Calvert Cantor, MSA, MSN, CNM 09/02/2021, 12:24 PM

## 2021-09-02 NOTE — MAU Note (Addendum)
.  Rhonda Massey is a 21 y.o. at [redacted]w[redacted]d here in MAU reporting: she started having N/V/D about 12 hours ago. Can't keep anything down. Reports some abd cramping and mild braxton hicks ctx. Also reports feeing hot and dizzy at times ?LMP:  ?Onset of complaint: 12hrs ago ?Pain score: 7/10 ? BP 124/71   Pulse (!) 143   Temp 99.1 ?F (37.3 ?C)   Resp 18   Ht 5' (1.524 m)   Wt 84.8 kg   LMP 12/03/2020 (Approximate)   BMI 36.52 kg/m?   ?FHT:150 ?Lab orders placed from triage:  U/A ? ?

## 2021-09-07 ENCOUNTER — Other Ambulatory Visit: Payer: Self-pay

## 2021-09-07 ENCOUNTER — Ambulatory Visit (INDEPENDENT_AMBULATORY_CARE_PROVIDER_SITE_OTHER): Payer: Medicaid Other | Admitting: Family Medicine

## 2021-09-07 VITALS — BP 126/87 | HR 94 | Wt 188.4 lb

## 2021-09-07 DIAGNOSIS — Z3480 Encounter for supervision of other normal pregnancy, unspecified trimester: Secondary | ICD-10-CM

## 2021-09-07 DIAGNOSIS — Z98891 History of uterine scar from previous surgery: Secondary | ICD-10-CM

## 2021-09-07 DIAGNOSIS — F418 Other specified anxiety disorders: Secondary | ICD-10-CM
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
## 2021-09-07 NOTE — Progress Notes (Signed)
GAD7 =9

## 2021-09-14 ENCOUNTER — Encounter: Payer: Medicaid Other | Admitting: Family Medicine

## 2021-09-21 ENCOUNTER — Ambulatory Visit (INDEPENDENT_AMBULATORY_CARE_PROVIDER_SITE_OTHER): Payer: Medicaid Other | Admitting: Family Medicine

## 2021-09-21 ENCOUNTER — Other Ambulatory Visit: Payer: Self-pay

## 2021-09-21 ENCOUNTER — Other Ambulatory Visit (HOSPITAL_COMMUNITY)
Admission: RE | Admit: 2021-09-21 | Discharge: 2021-09-21 | Disposition: A | Discharge status: Home / Self Care | Payer: Medicaid Other | Source: Ambulatory Visit | Attending: Family Medicine | Admitting: Family Medicine

## 2021-09-21 VITALS — BP 129/81 | HR 106 | Wt 192.0 lb

## 2021-09-21 DIAGNOSIS — Z3480 Encounter for supervision of other normal pregnancy, unspecified trimester: Secondary | ICD-10-CM | POA: Diagnosis not present

## 2021-09-21 DIAGNOSIS — Z3A36 36 weeks gestation of pregnancy: Secondary | ICD-10-CM

## 2021-09-21 DIAGNOSIS — O34211 Maternal care for low transverse scar from previous cesarean delivery: Secondary | ICD-10-CM

## 2021-09-21 DIAGNOSIS — Z98891 History of uterine scar from previous surgery: Secondary | ICD-10-CM
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
## 2021-09-22 LAB — GC/CHLAMYDIA PROBE AMP (~~LOC~~) NOT AT ARMC
Chlamydia: NEGATIVE
Comment: NEGATIVE
Comment: NORMAL
Neisseria Gonorrhea: NEGATIVE

## 2021-09-22 NOTE — Patient Instructions (Signed)
Rhonda Massey ? 09/22/2021 ? ? Your procedure is scheduled on:  10/06/2021 ? Arrive at 1045 at [employer] on [address] at [facility]  and [employer]. You are invited to use the FREE valet parking or use the Visitor's parking deck. ? Pick up the phone at the desk and dial [phone]. ? Call this number if you have problems the morning of surgery: [phone] ? Remember: ? ? Do not eat food:(After Midnight) Desp?s de medianoche. ? Do not drink clear liquids: (After Midnight) Desp?s de medianoche. ? Take these medicines the morning of surgery with A SIP OF WATER:  none ? ? Do not wear jewelry, make-up or nail polish. ? Do not wear lotions, powders, or perfumes. Do not wear deodorant. ? Do not shave 48 hours prior to surgery. ? Do not bring valuables to the hospital.  [facility] is not  ? responsible for any belongings or valuables brought to the hospital. ? Contacts, dentures or bridgework may not be worn into surgery. ? Leave suitcase in the car. After surgery it may be brought to your room. ? For patients admitted to the hospital, checkout time is 11:00 AM the day of  ?            discharge. ? ?   ? Please read over the following fact sheets that you were given:  ?   Preparing for Surgery ? ? ?

## 2021-09-23 ENCOUNTER — Encounter: Payer: Self-pay | Admitting: Obstetrics and Gynecology

## 2021-09-23 LAB — STREP GP B NAA: Strep Gp B NAA: NEGATIVE

## 2021-09-26 ENCOUNTER — Encounter (HOSPITAL_COMMUNITY): Payer: Self-pay

## 2021-09-27 ENCOUNTER — Encounter: Payer: Self-pay | Admitting: Obstetrics and Gynecology

## 2021-09-28 ENCOUNTER — Ambulatory Visit (INDEPENDENT_AMBULATORY_CARE_PROVIDER_SITE_OTHER): Payer: Medicaid Other | Admitting: Family Medicine

## 2021-09-28 ENCOUNTER — Other Ambulatory Visit: Payer: Self-pay

## 2021-09-28 VITALS — BP 126/84 | HR 89 | Wt 190.0 lb

## 2021-09-28 DIAGNOSIS — Z3480 Encounter for supervision of other normal pregnancy, unspecified trimester: Secondary | ICD-10-CM

## 2021-09-28 DIAGNOSIS — Z98891 History of uterine scar from previous surgery: Secondary | ICD-10-CM

## 2021-09-28 DIAGNOSIS — O09899 Supervision of other high risk pregnancies, unspecified trimester: Secondary | ICD-10-CM
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
## 2021-10-01 ENCOUNTER — Inpatient Hospital Stay (HOSPITAL_BASED_OUTPATIENT_CLINIC_OR_DEPARTMENT_OTHER): Payer: Medicaid Other

## 2021-10-01 ENCOUNTER — Inpatient Hospital Stay (HOSPITAL_COMMUNITY)
Admission: AD | Admit: 2021-10-01 | Discharge: 2021-10-03 | DRG: 784 | Disposition: A | Discharge status: Home / Self Care | Payer: Medicaid Other | Attending: Obstetrics and Gynecology | Admitting: Obstetrics and Gynecology

## 2021-10-01 ENCOUNTER — Inpatient Hospital Stay (HOSPITAL_COMMUNITY): Payer: Medicaid Other | Admitting: Anesthesiology

## 2021-10-01 ENCOUNTER — Other Ambulatory Visit: Payer: Self-pay

## 2021-10-01 ENCOUNTER — Encounter (HOSPITAL_COMMUNITY)
Admission: AD | Disposition: A | Discharge status: Home / Self Care | Payer: Self-pay | Source: Home / Self Care | Attending: Obstetrics and Gynecology

## 2021-10-01 ENCOUNTER — Inpatient Hospital Stay (EMERGENCY_DEPARTMENT_HOSPITAL)
Admission: AD | Admit: 2021-10-01 | Discharge: 2021-10-01 | Disposition: A | Discharge status: Home / Self Care | Payer: Medicaid Other | Source: Home / Self Care | Attending: Family Medicine | Admitting: Family Medicine

## 2021-10-01 ENCOUNTER — Inpatient Hospital Stay (HOSPITAL_COMMUNITY)
Admission: RE | Admit: 2021-10-01 | Payer: Medicaid Other | Source: Home / Self Care | Admitting: Obstetrics and Gynecology

## 2021-10-01 DIAGNOSIS — T1490XA Injury, unspecified, initial encounter: Secondary | ICD-10-CM

## 2021-10-01 DIAGNOSIS — Z3A39 39 weeks gestation of pregnancy: Secondary | ICD-10-CM | POA: Diagnosis not present

## 2021-10-01 DIAGNOSIS — A084 Viral intestinal infection, unspecified: Secondary | ICD-10-CM

## 2021-10-01 DIAGNOSIS — W1830XA Fall on same level, unspecified, initial encounter: Secondary | ICD-10-CM | POA: Diagnosis not present

## 2021-10-01 DIAGNOSIS — E669 Obesity, unspecified: Secondary | ICD-10-CM | POA: Diagnosis present

## 2021-10-01 DIAGNOSIS — O218 Other vomiting complicating pregnancy: Secondary | ICD-10-CM | POA: Insufficient documentation

## 2021-10-01 DIAGNOSIS — Z98891 History of uterine scar from previous surgery: Secondary | ICD-10-CM

## 2021-10-01 DIAGNOSIS — R109 Unspecified abdominal pain: Secondary | ICD-10-CM

## 2021-10-01 DIAGNOSIS — F32A Depression, unspecified: Secondary | ICD-10-CM | POA: Diagnosis present

## 2021-10-01 DIAGNOSIS — G44211 Episodic tension-type headache, intractable: Secondary | ICD-10-CM | POA: Diagnosis present

## 2021-10-01 DIAGNOSIS — O09899 Supervision of other high risk pregnancies, unspecified trimester: Secondary | ICD-10-CM

## 2021-10-01 DIAGNOSIS — O99214 Obesity complicating childbirth: Secondary | ICD-10-CM | POA: Diagnosis present

## 2021-10-01 DIAGNOSIS — O99353 Diseases of the nervous system complicating pregnancy, third trimester: Secondary | ICD-10-CM | POA: Insufficient documentation

## 2021-10-01 DIAGNOSIS — O99354 Diseases of the nervous system complicating childbirth: Secondary | ICD-10-CM | POA: Diagnosis not present

## 2021-10-01 DIAGNOSIS — Z302 Encounter for sterilization: Secondary | ICD-10-CM

## 2021-10-01 DIAGNOSIS — Z8616 Personal history of COVID-19: Secondary | ICD-10-CM | POA: Diagnosis not present

## 2021-10-01 DIAGNOSIS — Z3A38 38 weeks gestation of pregnancy: Secondary | ICD-10-CM | POA: Diagnosis not present

## 2021-10-01 DIAGNOSIS — Z3689 Encounter for other specified antenatal screening: Secondary | ICD-10-CM | POA: Diagnosis not present

## 2021-10-01 DIAGNOSIS — O34211 Maternal care for low transverse scar from previous cesarean delivery: Secondary | ICD-10-CM

## 2021-10-01 DIAGNOSIS — O9A213 Injury, poisoning and certain other consequences of external causes complicating pregnancy, third trimester: Secondary | ICD-10-CM

## 2021-10-01 DIAGNOSIS — O34219 Maternal care for unspecified type scar from previous cesarean delivery: Secondary | ICD-10-CM | POA: Diagnosis not present

## 2021-10-01 DIAGNOSIS — O4693 Antepartum hemorrhage, unspecified, third trimester: Secondary | ICD-10-CM | POA: Diagnosis not present

## 2021-10-01 DIAGNOSIS — F418 Other specified anxiety disorders: Secondary | ICD-10-CM | POA: Diagnosis present

## 2021-10-01 DIAGNOSIS — O99213 Obesity complicating pregnancy, third trimester: Secondary | ICD-10-CM | POA: Diagnosis not present

## 2021-10-01 DIAGNOSIS — F419 Anxiety disorder, unspecified: Secondary | ICD-10-CM | POA: Diagnosis present

## 2021-10-01 LAB — URINALYSIS, ROUTINE W REFLEX MICROSCOPIC
Bilirubin Urine: NEGATIVE
Glucose, UA: NEGATIVE mg/dL
Hgb urine dipstick: NEGATIVE
Ketones, ur: NEGATIVE mg/dL
Nitrite: NEGATIVE
Protein, ur: NEGATIVE mg/dL
Specific Gravity, Urine: 1.016 (ref 1.005–1.030)
pH: 7 (ref 5.0–8.0)

## 2021-10-01 LAB — COMPREHENSIVE METABOLIC PANEL
ALT: 21 U/L (ref 0–44)
AST: 19 U/L (ref 15–41)
Albumin: 2.6 g/dL — ABNORMAL LOW (ref 3.5–5.0)
Alkaline Phosphatase: 126 U/L (ref 38–126)
Anion gap: 9 (ref 5–15)
BUN: 8 mg/dL (ref 6–20)
CO2: 18 mmol/L — ABNORMAL LOW (ref 22–32)
Calcium: 8.8 mg/dL — ABNORMAL LOW (ref 8.9–10.3)
Chloride: 109 mmol/L (ref 98–111)
Creatinine, Ser: 0.37 mg/dL — ABNORMAL LOW (ref 0.44–1.00)
GFR, Estimated: >60 mL/min (ref >60)
Glucose, Bld: 119 mg/dL — ABNORMAL HIGH (ref 70–99)
Potassium: 3.6 mmol/L (ref 3.5–5.1)
Sodium: 136 mmol/L (ref 135–145)
Total Bilirubin: 0.4 mg/dL (ref 0.3–1.2)
Total Protein: 6.1 g/dL — ABNORMAL LOW (ref 6.5–8.1)

## 2021-10-01 LAB — CBC
HCT: 37.1 % (ref 36.0–46.0)
HCT: 37.1 % (ref 36.0–46.0)
Hemoglobin: 12.2 g/dL (ref 12.0–15.0)
Hemoglobin: 12.1 g/dL (ref 12.0–15.0)
MCH: 27.7 pg (ref 26.0–34.0)
MCH: 27.7 pg (ref 26.0–34.0)
MCHC: 32.9 g/dL (ref 30.0–36.0)
MCHC: 32.6 g/dL (ref 30.0–36.0)
MCV: 84.1 fL (ref 80.0–100.0)
MCV: 84.9 fL (ref 80.0–100.0)
Platelets: 291 10*3/uL (ref 150–400)
Platelets: 288 10*3/uL (ref 150–400)
RBC: 4.41 MIL/uL (ref 3.87–5.11)
RBC: 4.37 MIL/uL (ref 3.87–5.11)
RDW: 13.6 % (ref 11.5–15.5)
RDW: 13.4 % (ref 11.5–15.5)
WBC: 9.4 10*3/uL (ref 4.0–10.5)
WBC: 9.7 10*3/uL (ref 4.0–10.5)
nRBC: 0 % (ref 0.0–0.2)
nRBC: 0 % (ref 0.0–0.2)

## 2021-10-01 LAB — PROTEIN / CREATININE RATIO, URINE
Creatinine, Urine: 75.44 mg/dL
Protein Creatinine Ratio: 0.19 mg/mg{Cre} — ABNORMAL HIGH (ref 0.00–0.15)
Total Protein, Urine: 14 mg/dL

## 2021-10-01 LAB — TYPE AND SCREEN
ABO/RH(D): A POS
Antibody Screen: NEGATIVE

## 2021-10-01 SURGERY — Surgical Case
Anesthesia: Spinal | Wound class: Clean Contaminated

## 2021-10-01 MED ORDER — OXYTOCIN-SODIUM CHLORIDE 30-0.9 UT/500ML-% IV SOLN
INTRAVENOUS | Status: DC | PRN
Start: 2021-10-01 — End: 2021-10-01
  Administered 2021-10-01: 300 mL via INTRAVENOUS

## 2021-10-01 MED ORDER — SODIUM CHLORIDE 0.9% FLUSH: 3.0000 mL | INTRAVENOUS | Status: DC | PRN

## 2021-10-01 MED ORDER — PROMETHAZINE HCL 25 MG/ML IJ SOLN: 6.2500 mg | INTRAMUSCULAR | Status: DC | PRN

## 2021-10-01 MED ORDER — DIPHENHYDRAMINE HCL 25 MG PO CAPS: 25.0000 mg | ORAL_CAPSULE | ORAL | Status: DC | PRN

## 2021-10-01 MED ORDER — KETOROLAC TROMETHAMINE 30 MG/ML IJ SOLN: 30.0000 mg | Freq: Once | INTRAMUSCULAR | Status: AC | PRN

## 2021-10-01 MED ORDER — ONDANSETRON HCL 4 MG/2ML IJ SOLN
INTRAMUSCULAR | Status: DC | PRN
Start: 2021-10-01 — End: 2021-10-01
  Administered 2021-10-01: 4 mg via INTRAVENOUS

## 2021-10-01 MED ORDER — OXYCODONE HCL 5 MG/5ML PO SOLN: 5.0000 mg | Freq: Once | ORAL | Status: DC | PRN

## 2021-10-01 MED ORDER — MEPERIDINE HCL 25 MG/ML IJ SOLN: 6.2500 mg | INTRAMUSCULAR | Status: DC | PRN

## 2021-10-01 MED ORDER — CEFAZOLIN SODIUM-DEXTROSE 2-4 GM/100ML-% IV SOLN
INTRAVENOUS | Status: AC
  Filled 2021-10-01: qty 100

## 2021-10-01 MED ORDER — STERILE WATER FOR IRRIGATION IR SOLN
Status: DC | PRN
  Administered 2021-10-01: 1000 mL

## 2021-10-01 MED ORDER — DIPHENHYDRAMINE HCL 50 MG/ML IJ SOLN: 12.5000 mg | INTRAMUSCULAR | Status: DC | PRN

## 2021-10-01 MED ORDER — HYDROMORPHONE HCL 1 MG/ML IJ SOLN: 0.2500 mg | INTRAMUSCULAR | Status: DC | PRN

## 2021-10-01 MED ORDER — NALOXONE HCL 4 MG/10ML IJ SOLN
1.0000 ug/kg/h | INTRAVENOUS | Status: DC | PRN
  Filled 2021-10-01: qty 5

## 2021-10-01 MED ORDER — ACETAMINOPHEN 500 MG PO TABS
1000.0000 mg | ORAL_TABLET | Freq: Once | ORAL | Status: AC
  Administered 2021-10-01: 1000 mg via ORAL
  Filled 2021-10-01: qty 2

## 2021-10-01 MED ORDER — CEFAZOLIN SODIUM-DEXTROSE 2-3 GM-%(50ML) IV SOLR
INTRAVENOUS | Status: DC | PRN
  Administered 2021-10-01: 2 g via INTRAVENOUS

## 2021-10-01 MED ORDER — MORPHINE SULFATE (PF) 0.5 MG/ML IJ SOLN
INTRAMUSCULAR | Status: DC | PRN
  Administered 2021-10-01: 150 ug via INTRATHECAL

## 2021-10-01 MED ORDER — PHENYLEPHRINE HCL-NACL 20-0.9 MG/250ML-% IV SOLN
INTRAVENOUS | Status: AC
  Filled 2021-10-01: qty 250

## 2021-10-01 MED ORDER — DEXAMETHASONE SODIUM PHOSPHATE 4 MG/ML IJ SOLN
INTRAMUSCULAR | Status: AC
  Filled 2021-10-01: qty 1

## 2021-10-01 MED ORDER — OXYTOCIN-SODIUM CHLORIDE 30-0.9 UT/500ML-% IV SOLN
INTRAVENOUS | Status: AC
  Filled 2021-10-01: qty 500

## 2021-10-01 MED ORDER — SODIUM CHLORIDE 0.9 % IR SOLN
Status: DC | PRN
Start: 2021-10-01 — End: 2021-10-01
  Administered 2021-10-01: 1

## 2021-10-01 MED ORDER — ONDANSETRON 4 MG PO TBDP
4.0000 mg | ORAL_TABLET | Freq: Three times a day (TID) | ORAL | 0 refills | Status: DC | PRN
Start: 2021-10-01 — End: 2021-10-03

## 2021-10-01 MED ORDER — FENTANYL CITRATE (PF) 100 MCG/2ML IJ SOLN
INTRAMUSCULAR | Status: DC | PRN
Start: 2021-10-01 — End: 2021-10-01
  Administered 2021-10-01: 15 ug via INTRATHECAL

## 2021-10-01 MED ORDER — ONDANSETRON 4 MG PO TBDP
4.0000 mg | ORAL_TABLET | Freq: Once | ORAL | Status: AC
  Administered 2021-10-01: 4 mg via ORAL
  Filled 2021-10-01: qty 1

## 2021-10-01 MED ORDER — BUPIVACAINE HCL (PF) 0.5 % IJ SOLN
INTRAMUSCULAR | Status: AC
  Filled 2021-10-01: qty 30

## 2021-10-01 MED ORDER — FAMOTIDINE 20 MG PO TABS
20.0000 mg | ORAL_TABLET | Freq: Once | ORAL | Status: AC
Start: 2021-10-01 — End: 2021-10-01
  Administered 2021-10-01: 20 mg via ORAL
  Filled 2021-10-01: qty 1

## 2021-10-01 MED ORDER — NALOXONE HCL 0.4 MG/ML IJ SOLN: 0.4000 mg | INTRAMUSCULAR | Status: DC | PRN

## 2021-10-01 MED ORDER — FENTANYL CITRATE (PF) 100 MCG/2ML IJ SOLN
INTRAMUSCULAR | Status: AC
  Filled 2021-10-01: qty 2

## 2021-10-01 MED ORDER — FAMOTIDINE IN NACL 20-0.9 MG/50ML-% IV SOLN
20.0000 mg | Freq: Once | INTRAVENOUS | Status: AC
  Administered 2021-10-01: 20 mg via INTRAVENOUS
  Filled 2021-10-01: qty 50

## 2021-10-01 MED ORDER — OXYCODONE HCL 5 MG PO TABS: 5.0000 mg | ORAL_TABLET | Freq: Once | ORAL | Status: DC | PRN

## 2021-10-01 MED ORDER — LACTATED RINGERS IV BOLUS
1000.0000 mL | Freq: Once | INTRAVENOUS | Status: AC
  Administered 2021-10-01: 1000 mL via INTRAVENOUS

## 2021-10-01 MED ORDER — BUPIVACAINE IN DEXTROSE 0.75-8.25 % IT SOLN
INTRATHECAL | Status: DC | PRN
  Administered 2021-10-01: 1.6 mL via INTRATHECAL

## 2021-10-01 MED ORDER — MORPHINE SULFATE (PF) 0.5 MG/ML IJ SOLN
INTRAMUSCULAR | Status: AC
  Filled 2021-10-01: qty 10

## 2021-10-01 MED ORDER — PHENYLEPHRINE HCL-NACL 20-0.9 MG/250ML-% IV SOLN
INTRAVENOUS | Status: DC | PRN
  Administered 2021-10-01: 60 ug/min via INTRAVENOUS

## 2021-10-01 MED ORDER — ONDANSETRON HCL 4 MG/2ML IJ SOLN
INTRAMUSCULAR | Status: AC
  Filled 2021-10-01: qty 2

## 2021-10-01 MED ORDER — DEXAMETHASONE SODIUM PHOSPHATE 10 MG/ML IJ SOLN
INTRAMUSCULAR | Status: DC | PRN
Start: 2021-10-01 — End: 2021-10-01
  Administered 2021-10-01: 4 mg via INTRAVENOUS

## 2021-10-01 MED ORDER — LACTATED RINGERS IV SOLN: INTRAVENOUS | Status: DC | PRN

## 2021-10-01 MED ORDER — SOD CITRATE-CITRIC ACID 500-334 MG/5ML PO SOLN
30.0000 mL | Freq: Once | ORAL | Status: AC
  Administered 2021-10-01: 30 mL via ORAL
  Filled 2021-10-01: qty 30

## 2021-10-01 SURGICAL SUPPLY — 40 items
BENZOIN TINCTURE PRP APPL 2/3 (GAUZE/BANDAGES/DRESSINGS) ×2 IMPLANT
CLAMP CORD UMBIL (MISCELLANEOUS) IMPLANT
CLOTH BEACON ORANGE TIMEOUT ST (SAFETY) ×2 IMPLANT
DRAPE C SECTION CLR SCREEN (DRAPES) IMPLANT
DRSG OPSITE POSTOP 4X10 (GAUZE/BANDAGES/DRESSINGS) ×2 IMPLANT
ELECT REM PT RETURN 9FT ADLT (ELECTROSURGICAL)
ELECTRODE REM PT RTRN 9FT ADLT (ELECTROSURGICAL) ×1 IMPLANT
EXTRACTOR VACUUM M CUP 4 TUBE (SUCTIONS) IMPLANT
GLOVE BIO SURGEON STRL SZ7.5 (GLOVE) ×2 IMPLANT
GLOVE BIOGEL PI IND STRL 7.0 (GLOVE) ×2 IMPLANT
GLOVE BIOGEL PI INDICATOR 7.0 (GLOVE) ×2
GOWN STRL REUS W/TWL 2XL LVL3 (GOWN DISPOSABLE) ×2 IMPLANT
GOWN STRL REUS W/TWL LRG LVL3 (GOWN DISPOSABLE) ×4 IMPLANT
HEMOSTAT ARISTA ABSORB 3G PWDR (HEMOSTASIS) ×1 IMPLANT
KIT ABG SYR 3ML LUER SLIP (SYRINGE) IMPLANT
NDL HYPO 25X5/8 SAFETYGLIDE (NEEDLE) IMPLANT
NEEDLE HYPO 22GX1.5 SAFETY (NEEDLE) ×2 IMPLANT
NEEDLE HYPO 25X5/8 SAFETYGLIDE (NEEDLE) IMPLANT
NS IRRIG 1000ML POUR BTL (IV SOLUTION) ×2 IMPLANT
PACK C SECTION WH (CUSTOM PROCEDURE TRAY) ×2 IMPLANT
PAD OB MATERNITY 4.3X12.25 (PERSONAL CARE ITEMS) ×2 IMPLANT
PENCIL SMOKE EVAC W/HOLSTER (ELECTROSURGICAL) ×2 IMPLANT
RETRACTOR WND ALEXIS 25 LRG (MISCELLANEOUS) IMPLANT
RTRCTR C-SECT PINK 25CM LRG (MISCELLANEOUS) ×2 IMPLANT
RTRCTR WOUND ALEXIS 25CM LRG (MISCELLANEOUS) ×2
STRIP CLOSURE SKIN 1/2X4 (GAUZE/BANDAGES/DRESSINGS) ×2 IMPLANT
SUT CHROMIC 1 CTX 36 (SUTURE) ×4 IMPLANT
SUT PLAIN 0 NONE (SUTURE) ×2 IMPLANT
SUT PLAIN 2 0 XLH (SUTURE) ×1 IMPLANT
SUT VIC AB 1 CT1 36 (SUTURE) ×4 IMPLANT
SUT VIC AB 2-0 CT1 (SUTURE) ×2 IMPLANT
SUT VIC AB 3-0 CT1 27 (SUTURE) ×4
SUT VIC AB 3-0 CT1 TAPERPNT 27 (SUTURE) ×2 IMPLANT
SUT VIC AB 3-0 SH 27 (SUTURE)
SUT VIC AB 3-0 SH 27X BRD (SUTURE) IMPLANT
SUT VIC AB 4-0 KS 27 (SUTURE) ×2 IMPLANT
SYR BULB IRRIGATION 50ML (SYRINGE) IMPLANT
TOWEL OR 17X24 6PK STRL BLUE (TOWEL DISPOSABLE) ×2 IMPLANT
TRAY FOLEY W/BAG SLVR 14FR LF (SET/KITS/TRAYS/PACK) ×2 IMPLANT
WATER STERILE IRR 1000ML POUR (IV SOLUTION) ×2 IMPLANT

## 2021-10-01 NOTE — MAU Note (Signed)
Pt was seen in MAU earlier this morning s/p a fall at home and for elevated BP.  Pt was told to return to MAU if she started to have bleeding. Pt reports that an hour she began to have VB. Bleeding was light pink, then darker red, she had to wear a pad. +FM. 3/10 back and occassional ctx pain.  ? ?FHR - 145 ?

## 2021-10-01 NOTE — MAU Provider Note (Addendum)
?History  ?  ? ?CSN: [id number] ? ?Arrival date and time: 10/01/21 1907 ? ? Event Date/Time  ? First Provider Initiated Contact with Patient 10/01/21 1924   ?  ? ?Chief Complaint  ?Patient presents with  ? Vaginal Bleeding  ? ?HPI ?Rhonda Massey is a 21 y.o. [phone] at [redacted]w[redacted]d who presents to MAU for evaluation of new onset vaginal bleeding. She endorses seeing blood about 30 minutes ago and again when she wiped after voiding in MAU. ? ?Patient is s/p evaluation in MAU for presumed fall early this morning. She endorses periumbical abdominal pain which has not changed since her previous MAU encounter. She declines pain medication on arrival to MAU. She denies leaking of fluid, decreased fetal movement, fever, falls, or recent illness.  ? ?Patient is for repeat cesarean and BTL on 10/06/2021.  She has been NPO since 0800 this morning. ? ?OB History   ? ? Gravida  ?5  ? Para  ?1  ? Term  ?1  ? Preterm  ?0  ? AB  ?3  ? Living  ?1  ?  ? ? SAB  ?3  ? IAB  ?0  ? Ectopic  ?0  ? Multiple  ?0  ? Live Births  ?1  ?   ?  ?  ? ? ?Past Medical History:  ?Diagnosis Date  ? COVID-19 virus infection 07/16/2020  ? Symptoms started on 1/10; patient reports much improved on 1/14  ? Depression Knee Dysplasia  ? ? ?Past Surgical History:  ?Procedure Laterality Date  ? APPENDECTOMY  2018  ? CESAREAN SECTION N/A 08/21/2020  ? Procedure: CESAREAN SECTION;  Surgeon: Mitchel Honour, DO;  [facility]: [facility];  Service: Obstetrics;  Laterality: N/A;  ? KNEE SURGERY    ? LAPAROSCOPIC APPENDECTOMY N/A 03/02/2017  ? Procedure: APPENDECTOMY LAPAROSCOPIC;  Surgeon: Jimmye Norman, MD;  [facility]: [facility];  Service: General;  Laterality: N/A;  ? WISDOM TOOTH EXTRACTION    ? ? ?Family History  ?Problem Relation Age of Onset  ? Cancer Maternal Grandmother   ?     breast  ? ? ?Social History  ? ?Tobacco Use  ? Smoking status: Never  ? Smokeless tobacco: Never  ?Vaping Use  ? Vaping Use: Never used  ?Substance Use Topics  ? Alcohol use: Never  ? Drug use:  Never  ? ? ?Allergies:  ?Allergies  ?Allergen Reactions  ? Ibuprofen Nausea And Vomiting  ? Lavender Oil Rash  ? ? ?Medications Prior to Admission  ?Medication Sig Dispense Refill Last Dose  ? ondansetron (ZOFRAN-ODT) 4 MG disintegrating tablet Take 1 tablet (4 mg total) by mouth every 8 (eight) hours as needed for nausea or vomiting. 20 tablet 0   ? Prenatal Vit-Fe Fumarate-FA (PRENATAL MULTIVITAMIN) TABS tablet Take 1 tablet by mouth daily at 12 noon.     ? promethazine (PHENERGAN) 25 MG tablet Take 1 tablet (25 mg total) by mouth every 6 (six) hours as needed for nausea or vomiting. (Patient not taking: Reported on 09/21/2021) 30 tablet 0   ? ? ?Review of Systems  ?Genitourinary:  Positive for vaginal bleeding.  ?All other systems reviewed and are negative. ?Physical Exam  ? ?Last menstrual period 12/03/2020, unknown if currently breastfeeding. ? ?Physical Exam ?Vitals and nursing note reviewed. Exam conducted with a chaperone present.  ?Constitutional:   ?   Appearance: Normal appearance.  ?Cardiovascular:  ?   Rate and Rhythm: Normal rate and regular rhythm.  ?   Pulses: Normal  pulses.  ?   Heart sounds: Normal heart sounds.  ?Pulmonary:  ?   Effort: Pulmonary effort is normal.  ?   Breath sounds: Normal breath sounds.  ?Abdominal:  ?   Tenderness: There is no abdominal tenderness.  ?   Comments: Gravid  ?Genitourinary: ?   Comments: Pelvic exam: External genitalia normal, vaginal walls pink and well rugated, cervix visually closed, no lesions noted. Dark red clotted blood on labia. Scant dark red blood visible throughout vault. No bright red blood to suggest active bleeding. ? ? ?Skin: ?   Capillary Refill: Capillary refill takes less than 2 seconds.  ?Neurological:  ?   Mental Status: She is alert and oriented to person, place, and time.  ?Psychiatric:     ?   Mood and Affect: Mood normal.     ?   Behavior: Behavior normal.     ?   Thought Content: Thought content normal.     ?   Judgment: Judgment normal.   ? ? ?MAU Course  ?Procedures ? ?MDM ?--Dr. Debroah Loop called to discuss evaluation including Cat I tracing, previous encounter for fall, ongoing abdominal pain, new onset bleeding and plan for repeat cesarean. Dr. Debroah Loop agrees with my plan to monitor, collect new MFM OB Limited. Will also collect CBC. ? ?--Dr. Judeth Cornfield called MAU to discuss recommendation for delivery either tonight or tomorrow. Per Dr. Judeth Cornfield repeat ultrasound indicated if patient is going to have cesarean tomorrow. Dr. Debroah Loop called back at 1952, reviewed Dr. Zannie Kehr recommendation. Dr. Debroah Loop to round on patient, consent for surgery. ? ?Orders Placed This Encounter  ?Procedures  ? [place] MFM OB Limited  ? CBC  ? RPR  ? Diet NPO time specified  ? Type and screen [facility]  ? Insert peripheral IV  ? Saline lock IV  ? ?  ?Media Information ? ? ?  ? ? ? ? ? ? ?Assessment and Plan  ?--20 y.o. [id number] at [redacted]w[redacted]d  ?--Cat I tracing ?--Recurrent vaginal bleeding ?--For repeat cesarean, NPO since 0800 this morning ?--Patient continues to desire BTL ?--Cervix 1/thick/ballotable ?--Per Dr. Debroah Loop, prep for OR ? ?Calvert Cantor, CNM ?10/01/2021, 8:21 PM  ?

## 2021-10-01 NOTE — Transfer of Care (Signed)
Immediate Anesthesia Transfer of Care Note ? ?Patient: Rhonda Massey ? ?Procedure(s) Performed: CESAREAN SECTION WITH BILATERAL TUBAL LIGATION ? ?Patient Location: PACU ? ?Anesthesia Type:Spinal ? ?Level of Consciousness: awake ? ?Airway & Oxygen Therapy: Patient Spontanous Breathing ? ?Post-op Assessment: Report given to RN and Post -op Vital signs reviewed and stable ? ?Post vital signs: Reviewed and stable ? ?Last Vitals:  ?Vitals Value Taken Time  ?BP    ?Temp    ?Pulse    ?Resp    ?SpO2    ? ? ?Last Pain:  ?Vitals:  ? 10/01/21 1939  ?TempSrc: Oral  ?PainSc:   ?   ? ?  ? ?Complications: No notable events documented. ?

## 2021-10-01 NOTE — Discharge Summary (Signed)
? ?  Postpartum Discharge Summary ?   ?Patient Name: Rhonda Massey ?DOB: 05-30-2001 ?MRN: [id number] ? ?Date of admission: 10/01/2021 ?Delivery date:10/01/2021  ?Delivering provider: Woodroe Mode  ?Date of discharge: 10/03/2021 ? ?Admitting diagnosis: bleeding ?Intrauterine pregnancy: [redacted]w[redacted]d    ?Secondary diagnosis:  Active Problems: ?  History of cesarean delivery ?  Mixed anxiety and depressive disorder ?  BMI 30s ?  Short interval between pregnancies complicating pregnancy, antepartum, unspecified trimester ? ?Additional problems: N/A    ?Discharge diagnosis: Term Pregnancy Delivered                                              ?Post partum procedures:postpartum tubal ligation ?Augmentation: N/A ?Complications: None ? ?Hospital course: Sceduled C/S   21y.o. yo [id number] admitted to the hospital 10/01/2021 for scheduled cesarean section with the following indication:Elective Repeat and vaginal bleeding . She was initially scheduled on 4/6, however presented today after a fall with new onset abdominal pain and vaginal bleeding so proceeded with C/S. Delivery details are as follows:  ?Membrane Rupture Time/Date:  ,   ?Delivery Method:  ?Details of operation can be found in separate operative note.  Patient had an uncomplicated postpartum course.  She is ambulating, tolerating a regular diet, passing flatus, and urinating well. Patient is discharged home in stable condition on  10/01/21 ?       ?Newborn Data: ?Birth date:10/01/2021  ?Birth time:9:58 PM  ?Gender:Female  ?Living status:  ?Apgars:9 ,9  ?Weight:3000 g    ? ?Magnesium Sulfate received: No ?BMZ received: No ?Rhophylac:No ?MMR:No ?T-DaP:Given prenatally ?Flu: N/A ?Transfusion:No ? ?Physical exam  ?Vitals:  ? 10/01/21 1939 10/01/21 1941 10/01/21 1942  ?BP: 129/84  132/80  ?Pulse: (!) 102  99  ?Resp: 18    ?Temp: 99.1 ?F (37.3 ?C)    ?TempSrc: Oral    ?SpO2: 99% 99%   ? ?General: alert, cooperative, and no distress ?Lochia: appropriate ?Uterine  Fundus: firm ?Incision: small dried drainage on honeycomb ?DVT Evaluation: No evidence of DVT seen on physical exam. ?Labs: ?Lab Results  ?Component Value Date  ? WBC 9.7 10/01/2021  ? HGB 12.1 10/01/2021  ? HCT 37.1 10/01/2021  ? MCV 84.9 10/01/2021  ? PLT 288 10/01/2021  ? ? ?  Latest Ref Rng & Units 10/01/2021  ?  7:02 AM  ?CMP  ?Glucose 70 - 99 mg/dL 119    ?BUN 6 - 20 mg/dL 8    ?Creatinine 0.44 - 1.00 mg/dL 0.37    ?Sodium 135 - 145 mmol/L 136    ?Potassium 3.5 - 5.1 mmol/L 3.6    ?Chloride 98 - 111 mmol/L 109    ?CO2 22 - 32 mmol/L 18    ?Calcium 8.9 - 10.3 mg/dL 8.8    ?Total Protein 6.5 - 8.1 g/dL 6.1    ?Total Bilirubin 0.3 - 1.2 mg/dL 0.4    ?Alkaline Phos 38 - 126 U/L 126    ?AST 15 - 41 U/L 19    ?ALT 0 - 44 U/L 21    ? ?Edinburgh Score: ? ?  08/23/2020  ? 11:04 AM  ?EdFlavia Shipperostnatal Depression Scale Screening Tool  ?I have been able to laugh and see the funny side of things. 0  ?I have looked forward with enjoyment to things. 0  ?I have blamed myself unnecessarily when  things went wrong. 1  ?I have been anxious or worried for no good reason. 0  ?I have felt scared or panicky for no good reason. 0  ?Things have been getting on top of me. 0  ?I have been so unhappy that I have had difficulty sleeping. 0  ?I have felt sad or miserable. 1  ?I have been so unhappy that I have been crying. 0  ?The thought of harming myself has occurred to me. 0  ?Edinburgh Postnatal Depression Scale Total 2  ? ? ? ?After visit meds:  ?Allergies as of 10/03/2021   ? ?   Reactions  ? Ibuprofen Nausea And Vomiting  ? Lavender Oil Rash  ? ?  ? ?  ?Medication List  ?  ? ?STOP taking these medications   ? ?ondansetron 4 MG disintegrating tablet ?Commonly known as: ZOFRAN-ODT ?  ?promethazine 25 MG tablet ?Commonly known as: PHENERGAN ?  ? ?  ? ?TAKE these medications   ? ?acetaminophen 500 MG tablet ?Commonly known as: TYLENOL ?Take 2 tablets (1,000 mg total) by mouth every 6 (six) hours. ?What changed:  ?how much to take ?when to  take this ?reasons to take this ?  ?calcium carbonate 500 MG chewable tablet ?Commonly known as: TUMS - dosed in mg elemental calcium ?Chew 1 tablet by mouth daily as needed for indigestion or heartburn. ?  ?docusate sodium 100 MG capsule ?Commonly known as: COLACE ?Take 1 capsule (100 mg total) by mouth 2 (two) times daily as needed. ?  ?oxyCODONE 5 MG immediate release tablet ?Commonly known as: Oxy IR/ROXICODONE ?Take 1 tablet (5 mg total) by mouth every 4 (four) hours as needed for up to 3 days for moderate pain. ?  ?prenatal multivitamin Tabs tablet ?Take 1 tablet by mouth daily at 12 noon. ?  ?simethicone 80 MG chewable tablet ?Commonly known as: MYLICON ?Chew 1 tablet (80 mg total) by mouth 3 (three) times daily after meals. ?  ? ?  ? ? ? ?Discharge home in stable condition ?Infant Feeding: Breast ?Infant Disposition:home with mother ?Discharge instruction: per After Visit Summary and Postpartum booklet. ?Activity: Advance as tolerated. Pelvic rest for 6 weeks.  ?Diet: routine diet ?Future Appointments: ?Future Appointments  ?Date Time Provider [place]  ?10/04/2021  9:30 AM [facility] PAT 1 [facility] None  ?10/05/2021  8:35 AM Aletha Halim, MD [facility]  ? ?Follow up Visit: ? ?Message sent to [facility] by Dr Higinio Plan:  ?Please schedule this patient for a In person postpartum visit in 6 weeks with the following provider: Any provider. ?Additional Postpartum F/U:Incision check 1 week  ?Low risk pregnancy complicated by:  previous CS  ?Delivery mode: Repeat C/S ?Anticipated Birth Control:  BTL done PP ? ? ?Mallie Snooks, MSA, MSN, CNM ?Certified Nurse Midwife, [place] [facility] [place] ? ? ? ? ?

## 2021-10-01 NOTE — Op Note (Signed)
Rhonda Massey ?PROCEDURE DATE: 10/01/2021 ? ?PREOPERATIVE DIAGNOSIS: Intrauterine pregnancy at  [redacted]w[redacted]d weeks gestation;  previous uterine incision, vaginal bleeding, undesired fertility ? ?POSTOPERATIVE DIAGNOSIS: The same ? ?PROCEDURE: Repeat Low Transverse Cesarean Section with Bilateral Tubal Ligation using Filshie Clips  ? ?SURGEON:  Dr. Scheryl Darter  ? ?ASSISTANT: Dr. Leticia Penna  ? ?INDICATIONS: Rhonda Massey is a 21 y.o. [phone] at [redacted]w[redacted]d scheduled for cesarean section secondary to  previous uterine incision and vaginal bleeding .  The risks of cesarean section discussed with the patient included but were not limited to: bleeding which may require transfusion or reoperation; infection which may require antibiotics; injury to bowel, bladder, ureters or other surrounding organs; injury to the fetus; need for additional procedures including hysterectomy in the event of a life-threatening hemorrhage; placental abnormalities wth subsequent pregnancies, incisional problems, thromboembolic phenomenon and other postoperative/anesthesia complications. The patient concurred with the proposed plan, giving informed written consent for the procedure.   ? ?FINDINGS:  Viable boy infant in cephalic OP presentation.  Apgars 9 and 9, weight 3000g.  Cloudy amniotic fluid.  Intact placenta, three vessel cord.  Normal uterus, fallopian tubes and ovaries bilaterally. Minimal adhesive disease.  ? ?ANESTHESIA:    Spinal ?INTRAVENOUS FLUIDS: 2,000 ml ?ESTIMATED BLOOD LOSS: 274 ml ?URINE OUTPUT:  300 ml ?SPECIMENS: Placenta sent to L&D ?COMPLICATIONS: None immediate ? ?PROCEDURE IN DETAIL:  The patient received intravenous antibiotics and had sequential compression devices applied to her lower extremities while in the preoperative area.  She was then taken to the operating room where spinal anesthesia was administered (epidural anesthesia was dosed up to surgical level) and was found to be adequate. She was then  placed in a dorsal supine position with a leftward tilt, and prepped and draped in a sterile manner.  A foley catheter was placed into her bladder and attached to constant gravity, which drained clear fluid throughout.  After an adequate timeout was performed, a Pfannenstiel skin incision was made with scalpel and carried through to the underlying layer of fascia. The fascia was incised in the midline and this incision was extended bilaterally using the Mayo scissors. Kocher clamps were applied to the superior aspect of the fascial incision and the underlying rectus muscles were dissected off bluntly. A similar process was carried out on the inferior aspect of the facial incision. The rectus muscles were separated in the midline bluntly and the peritoneum was entered bluntly. An Alexis retractor was placed to aid in visualization of the uterus. A small uterine adhesion was noted over the lower uterine segment, Metz scissors were used to create a flap which was extended bluntly.  Attention was turned to the lower uterine segment where a transverse hysterotomy was made with a scalpel and extended bilaterally bluntly. The infant was successfully delivered, and cord was clamped and cut and infant was handed over to awaiting neonatology team.  ? ?Uterine massage was then administered and the placenta delivered intact with three-vessel cord. The uterus was then cleared of clot and debris.  The hysterotomy was closed with 0 Vicryl in a running locked fashion, and an imbricating layer was also placed with a 0 Vicryl.  ? ?Attention was then turned to the patient's tubes, and left fallopian tube was identified and followed out to the fimbriated end.  A Filshie clip was placed on the left fallopian tube about 2 cm from the cornual attachment, with care given to incorporate the underlying mesosalpinx.  A similar process was carried out on  the right side allowing for bilateral tubal sterilization.  ? ?Overall, excellent  hemostasis was noted at the hysterotomy, however had small regions of ooze over the serosa where the flap was created, thus arista was placed. The abdomen and the pelvis were cleared of all clot and debris and the Jon Gills was removed. Hemostasis was confirmed on all surfaces.  The peritoneum was reapproximated using 2-0 vicryl running stitches. The fascia was then closed using 0 Vicryl in a running fashion. The subcutaneous layer was reapproximated with plain gut and the skin was closed with 4-0 vicryl. The patient tolerated the procedure well. Sponge, lap, instrument and needle counts were correct x 2. She was taken to the recovery room in stable condition.  ? ? ?Allayne Stack, DO ?10/01/2021 10:49 PM  ?

## 2021-10-01 NOTE — MAU Provider Note (Addendum)
?History  ?  ? ?CSN: [id number] ? ?Arrival date and time: 10/01/21 0558 ? ? Event Date/Time  ? First Provider Initiated Contact with Patient 10/01/21 0710   ?  ? ?Chief Complaint  ?Patient presents with  ? Hypertension  ? Fever  ? ?HPI ?Rhonda Massey is a 21 y.o. G5P1031 at [redacted]w[redacted]d who presents to MAU for elevated BP, nausea, and fever. Patient reports she woke up around 2:30am feeling nauseous. She then reports she woke up 30 minutes later between the toilet and wall- thinks she passed out. This was an unwitnessed event. She is unsure if she hit her abdomen, but reports a sharp, constant pain on the right side of her umbilicus that she did not have before. She went and took her BP and said that it was 140/92. She also reports a mild headache, 4/10 , and having hot flashes. She took her temp around 3am and it was 102. She did take some Tylenol, but threw it up soon after. She reports that she has had multiple loose stools today. No sick contacts, no cough/congestion/sore throat. She denies contractions, vaginal bleeding, leaking fluid, vision changes, or RUQ/epigastric pain. Endorses active fetal movement.  ? ?Patient receives prenatal care at [facility] and is scheduled for a rLTCS on 4/6.  ? ?OB History   ? ? Gravida  ?5  ? Para  ?1  ? Term  ?1  ? Preterm  ?0  ? AB  ?3  ? Living  ?1  ?  ? ? SAB  ?3  ? IAB  ?0  ? Ectopic  ?0  ? Multiple  ?0  ? Live Births  ?1  ?   ?  ?  ? ? ?Past Medical History:  ?Diagnosis Date  ? COVID-19 virus infection 07/16/2020  ? Symptoms started on 1/10; patient reports much improved on 1/14  ? Depression Knee Dysplasia  ? ? ?Past Surgical History:  ?Procedure Laterality Date  ? APPENDECTOMY  2018  ? CESAREAN SECTION N/A 08/21/2020  ? Procedure: CESAREAN SECTION;  Surgeon: Linda Hedges, DO;  [facility]: [facility];  Service: Obstetrics;  Laterality: N/A;  ? KNEE SURGERY    ? LAPAROSCOPIC APPENDECTOMY N/A 03/02/2017  ? Procedure: APPENDECTOMY LAPAROSCOPIC;  Surgeon: Judeth Horn, MD;   Location: [place];  Service: General;  Laterality: N/A;  ? WISDOM TOOTH EXTRACTION    ? ? ?Family History  ?Problem Relation Age of Onset  ? Cancer Maternal Grandmother   ?     breast  ? ? ?Social History  ? ?Tobacco Use  ? Smoking status: Never  ? Smokeless tobacco: Never  ?Vaping Use  ? Vaping Use: Never used  ?Substance Use Topics  ? Alcohol use: Never  ? Drug use: Never  ? ? ?Allergies:  ?Allergies  ?Allergen Reactions  ? Ibuprofen Nausea And Vomiting  ? Lavender Oil Rash  ? ? ?Medications Prior to Admission  ?Medication Sig Dispense Refill Last Dose  ? Prenatal Vit-Fe Fumarate-FA (PRENATAL MULTIVITAMIN) TABS tablet Take 1 tablet by mouth daily at 12 noon.   09/30/2021 at 0800  ? promethazine (PHENERGAN) 25 MG tablet Take 1 tablet (25 mg total) by mouth every 6 (six) hours as needed for nausea or vomiting. (Patient not taking: Reported on 09/21/2021) 30 tablet 0   ? ?Review of Systems  ?Constitutional: Negative.   ?Respiratory: Negative.    ?Cardiovascular: Negative.   ?Gastrointestinal:  Positive for abdominal pain, diarrhea (loose stools), nausea and vomiting.  ?Genitourinary: Negative.   ?  Musculoskeletal: Negative.   ? ?Physical Exam  ? ?Patient Vitals for the past 24 hrs: ? BP Temp Pulse Resp SpO2 Height Weight  ?10/01/21 0735 -- -- -- -- 98 % -- --  ?10/01/21 0731 111/74 -- (!) 109 -- 98 % -- --  ?10/01/21 0715 121/82 -- (!) 124 -- -- -- --  ?10/01/21 0701 123/80 -- (!) 114 -- 98 % -- --  ?10/01/21 0642 124/83 -- (!) 110 -- 98 % -- --  ?10/01/21 0621 129/84 -- (!) 113 17 98 % -- --  ?10/01/21 0613 -- 98.5 ?F (36.9 ?C) -- 17 -- 5' (1.524 m) 87.5 kg  ? ? ?Physical Exam ?Vitals and nursing note reviewed.  ?Constitutional:   ?   General: She is not in acute distress. ?Eyes:  ?   Extraocular Movements: Extraocular movements intact.  ?   Pupils: Pupils are equal, round, and reactive to light.  ?Cardiovascular:  ?   Rate and Rhythm: Tachycardia present.  ?Pulmonary:  ?   Effort: Pulmonary effort is normal.   ?Abdominal:  ?   Palpations: Abdomen is soft.  ?   Tenderness: There is no abdominal tenderness.  ?   Comments: gravid  ?Musculoskeletal:     ?   General: Normal range of motion.  ?Skin: ?   General: Skin is warm and dry.  ?Neurological:  ?   General: No focal deficit present.  ?   Mental Status: She is alert and oriented to person, place, and time.  ?Psychiatric:     ?   Mood and Affect: Mood normal.     ?   Behavior: Behavior normal.     ?   Thought Content: Thought content normal.     ?   Judgment: Judgment normal.  ? ?NST ?FHR: 135 bpm, moderate variability, +15x15 accels, no decels ?Toco: occasional ? ?Results for orders placed or performed during the hospital encounter of 10/01/21 (from the past 24 hour(s))  ?Urinalysis, Routine w reflex microscopic Urine, Clean Catch     Status: Abnormal  ? Collection Time: 10/01/21  6:30 AM  ?Result Value Ref Range  ? Color, Urine YELLOW YELLOW  ? APPearance CLOUDY (A) CLEAR  ? Specific Gravity, Urine 1.016 1.005 - 1.030  ? pH 7.0 5.0 - 8.0  ? Glucose, UA NEGATIVE NEGATIVE mg/dL  ? Hgb urine dipstick NEGATIVE NEGATIVE  ? Bilirubin Urine NEGATIVE NEGATIVE  ? Ketones, ur NEGATIVE NEGATIVE mg/dL  ? Protein, ur NEGATIVE NEGATIVE mg/dL  ? Nitrite NEGATIVE NEGATIVE  ? Leukocytes,Ua LARGE (A) NEGATIVE  ? RBC / HPF 0-5 0 - 5 RBC/hpf  ? WBC, UA 11-20 0 - 5 WBC/hpf  ? Bacteria, UA RARE (A) NONE SEEN  ? Squamous Epithelial / LPF 11-20 0 - 5  ? Mucus PRESENT   ?Protein / creatinine ratio, urine     Status: Abnormal  ? Collection Time: 10/01/21  6:30 AM  ?Result Value Ref Range  ? Creatinine, Urine 75.44 mg/dL  ? Total Protein, Urine 14 mg/dL  ? Protein Creatinine Ratio 0.19 (H) 0.00 - 0.15 mg/mg[Cre]  ?CBC     Status: None  ? Collection Time: 10/01/21  7:02 AM  ?Result Value Ref Range  ? WBC 9.4 4.0 - 10.5 K/uL  ? RBC 4.41 3.87 - 5.11 MIL/uL  ? Hemoglobin 12.2 12.0 - 15.0 g/dL  ? HCT 37.1 36.0 - 46.0 %  ? MCV 84.1 80.0 - 100.0 fL  ? MCH 27.7 26.0 - 34.0 pg  ? MCHC  32.9 30.0 - 36.0 g/dL   ? RDW 13.6 11.5 - 15.5 %  ? Platelets 291 150 - 400 K/uL  ? nRBC 0.0 0.0 - 0.2 %  ?Comprehensive metabolic panel     Status: Abnormal  ? Collection Time: 10/01/21  7:02 AM  ?Result Value Ref Range  ? Sodium 136 135 - 145 mmol/L  ? Potassium 3.6 3.5 - 5.1 mmol/L  ? Chloride 109 98 - 111 mmol/L  ? CO2 18 (L) 22 - 32 mmol/L  ? Glucose, Bld 119 (H) 70 - 99 mg/dL  ? BUN 8 6 - 20 mg/dL  ? Creatinine, Ser 0.37 (L) 0.44 - 1.00 mg/dL  ? Calcium 8.8 (L) 8.9 - 10.3 mg/dL  ? Total Protein 6.1 (L) 6.5 - 8.1 g/dL  ? Albumin 2.6 (L) 3.5 - 5.0 g/dL  ? AST 19 15 - 41 U/L  ? ALT 21 0 - 44 U/L  ? Alkaline Phosphatase 126 38 - 126 U/L  ? Total Bilirubin 0.4 0.3 - 1.2 mg/dL  ? GFR, Estimated >60 >60 mL/min  ? Anion gap 9 5 - 15  ? ?MAU Course  ?Procedures ?Pepcid ?Zofran ?Tylenol ? ?MDM ?NST reassuring and reactive for gestational age. Toco with occasional contraction. Abdomen soft, non-tender. Serial BPs-all normotensive, CBC/CMP/UPCR unremarkable, Zofran ODT, Pepcid, and Tyleno ordered. [place] ordered given questionable fall. Care handed over to [facility], CNM at 0800.  ?Maryagnes Amos, CNM ?October 02, 2018 0800 ? ?0920: Labs reviewed, no signs of PEC.  Reassessed patient.  Nausea improved, still has mild headache. Unclear if had true syncopal episode, pt reports to me she was standing over the toilet vomiting and then woke up 30 minutes later sitting on the floor between the toilet and the tub. Ultrasound normal, no signs of abruption.  Reported fever at home, afebrile here, appears well.  Possible viral process, discussed supportive measures.  Stable for discharge home. ? ?Assessment and Plan  ? ?1. [redacted] weeks gestation of pregnancy   ?2. NST (non-stress test) reactive   ?3. Episodic tension-type headache, intractable   ?4. Viral gastroenteritis   ? ?Discharge home ?Follow-up at [facility] this week as scheduled ?Fetal movement counts ?Labor precautions ?Rx Zofran ? ? ?Allergies as of 10/01/2021   ? ?   Reactions  ? Ibuprofen Nausea And  Vomiting  ? Lavender Oil Rash  ? ?  ? ?  ?Medication List  ?  ? ?TAKE these medications   ? ?ondansetron 4 MG disintegrating tablet ?Commonly known as: ZOFRAN-ODT ?Take 1 tablet (4 mg total) by mouth every 8 (eight)

## 2021-10-01 NOTE — H&P (Signed)
Rhonda Massey is a 21 y.o. female presenting for episode of vaginal bleeding. [id number] ?[redacted]w[redacted]d ?She is scheduled for RCS and BTL at 39 weeks. She endorses seeing blood about 30 minutes ago and again when she wiped after voiding in MAU. ?  ?Patient is s/p evaluation in MAU for presumed fall early this morning. She endorses periumbical abdominal pain which has not changed since her previous MAU encounter. She declines pain medication on arrival to MAU. She denies leaking of fluid, decreased fetal movement, fever, falls, or recent illness.  ?  ?Patient is for repeat cesarean and BTL on 10/06/2021.  She has been NPO since 0800 this morning. Dr. Judeth Cornfield suggested delivery at this time. ?OB History   ? ? Gravida  ?5  ? Para  ?1  ? Term  ?1  ? Preterm  ?0  ? AB  ?3  ? Living  ?1  ?  ? ? SAB  ?3  ? IAB  ?0  ? Ectopic  ?0  ? Multiple  ?0  ? Live Births  ?1  ?   ?  ?  ? ?Past Medical History:  ?Diagnosis Date  ? COVID-19 virus infection 07/16/2020  ? Symptoms started on 1/10; patient reports much improved on 1/14  ? Depression Knee Dysplasia  ? ?Past Surgical History:  ?Procedure Laterality Date  ? APPENDECTOMY  2018  ? CESAREAN SECTION N/A 08/21/2020  ? Procedure: CESAREAN SECTION;  Surgeon: Mitchel Honour, DO;  [facility]: [facility];  Service: Obstetrics;  Laterality: N/A;  ? KNEE SURGERY    ? LAPAROSCOPIC APPENDECTOMY N/A 03/02/2017  ? Procedure: APPENDECTOMY LAPAROSCOPIC;  Surgeon: Jimmye Norman, MD;  Location: [facility] OR;  Service: General;  Laterality: N/A;  ? WISDOM TOOTH EXTRACTION    ? ?Family History: family history includes Cancer in her maternal grandmother. ?Social History:  reports that she has never smoked. She has never used smokeless tobacco. She reports that she does not drink alcohol and does not use drugs. ? ? ?  ?Maternal Diabetes: No ?Genetic Screening: Normal ?Maternal Ultrasounds/Referrals: Normal ?Fetal Ultrasounds or other Referrals:  None ?Maternal Substance Abuse:  No ?Significant Maternal  Medications:  None ?Significant Maternal Lab Results:  Group B Strep negative ?Other Comments:  None ? ?Review of Systems  ?Constitutional: Negative.   ?Respiratory: Negative.    ?Gastrointestinal: Negative.   ?Genitourinary:  Positive for vaginal bleeding.  ?Maternal Medical History:  ?Reason for admission: Vaginal bleeding.  ? ?Contractions: Frequency: rare.   ?Fetal activity: Perceived fetal activity is normal.   ?Prenatal complications: Bleeding.   ? ?  ?Blood pressure 132/80, pulse 99, temperature 99.1 ?F (37.3 ?C), temperature source Oral, resp. rate 18, last menstrual period 12/03/2020, SpO2 99 %, unknown if currently breastfeeding. ?Maternal Exam:  ?Abdomen: Patient reports no abdominal tenderness. Surgical scars: low transverse.   ?Introitus: not evaluated.   ?Cervix: not evaluated. ? ?Physical Exam ?Vitals and nursing note reviewed.  ?Constitutional:   ?   Appearance: Normal appearance. She is obese.  ?HENT:  ?   Head: Normocephalic and atraumatic.  ?Eyes:  ?   Pupils: Pupils are equal, round, and reactive to light.  ?Cardiovascular:  ?   Rate and Rhythm: Normal rate.  ?Pulmonary:  ?   Effort: Pulmonary effort is normal.  ?Skin: ?   General: Skin is warm and dry.  ?Neurological:  ?   Mental Status: She is alert.  ?Psychiatric:     ?   Mood and Affect: Mood normal.     ?  Behavior: Behavior normal.  ?  ?Prenatal labs: ?ABO, Rh: A/Positive/-- (09/20 1625) ?Antibody: Negative (09/20 1625) ?Rubella: 2.00 (09/20 1625) ?RPR: Non Reactive (01/16 0853)  ?HBsAg: Negative (09/20 1625)  ?HIV: Non Reactive (01/16 0853)  ?GBS: Negative/-- (03/22 3546)  ? ?Assessment/Plan:Repeat CS and BTL ?The risks of surgery were discussed with the patient including but were not limited to: bleeding which may require transfusion or reoperation; infection which may require antibiotics; injury to bowel, bladder, ureters or other surrounding organs; injury to the fetus; need for additional procedures including hysterectomy in the event  of a life-threatening hemorrhage; formation of adhesions; placental abnormalities with subsequent pregnancies; incisional problems; thromboembolic phenomenon and other postoperative/anesthesia complications.  The patient concurred with the proposed plan, giving informed written consent for the procedure.   Patient has been NPO since 0800 she will remain NPO for procedure. Anesthesia and OR aware. Preoperative prophylactic antibiotics and SCDs ordered on call to the OR.  To OR when ready. ? 21 y.o. [id number] with undesired fertility,for CS desires permanent sterilization. Risks and benefits of procedure discussed with patient including permanence of method, Filshie clips, bleeding, infection, injury to surrounding organs and need for additional procedures. Risk failure of 0.5-1% with increased risk of ectopic gestation if pregnancy occurs was also discussed with patient. Patient verbalized understanding and all questions were answered. ? ? ? ?     ? ?Scheryl Darter ?10/01/2021, 8:23 PM ? ? ? ? ?

## 2021-10-01 NOTE — MAU Note (Addendum)
Pt is G5P1 at 38wks2d pregnant. States awoke at 0230 and felt nausea. Went to BR and "passed out and woke up later". I got back to bed and took my b/p and it was 140/92. Felt like was having hot flashes and took my temp and was 102. Took Tylenol 500mg  but threw up about later. Unsure if hit her abdomen when she passed out. Denies VB, LOF. Pt reports feeling FM since passing out. Having some abdominal pain that feels like ctxs but constant abdominal pain otherwise. Constant pain is to R of belly button and goes downward and is worse with movement ?

## 2021-10-01 NOTE — Anesthesia Procedure Notes (Signed)
Spinal ? ?Patient location during procedure: OB ?Start time: 10/01/2021 9:22 PM ?End time: 10/01/2021 9:27 PM ?Reason for block: surgical anesthesia ?Staffing ?Performed: anesthesiologist  ?Anesthesiologist: Lynda Rainwater, MD ?Preanesthetic Checklist ?Completed: patient identified, IV checked, risks and benefits discussed, surgical consent, monitors and equipment checked, pre-op evaluation and timeout performed ?Spinal Block ?Patient position: sitting ?Prep: DuraPrep and site prepped and draped ?Patient monitoring: heart rate, cardiac monitor, continuous pulse ox and blood pressure ?Approach: midline ?Location: L3-4 ?Injection technique: single-shot ?Needle ?Needle type: Pencan  ?Needle gauge: 24 G ?Needle length: 10 cm ?Assessment ?Sensory level: T4 ?Events: CSF return ? ? ? ?

## 2021-10-01 NOTE — Anesthesia Preprocedure Evaluation (Signed)
Anesthesia Evaluation  ?Patient identified by MRN, date of birth, ID band ?Patient awake ? ? ? ?Reviewed: ?Allergy & Precautions, NPO status , Patient's Chart, lab work & pertinent test results ? ?Airway ?Mallampati: II ? ?TM Distance: >3 FB ?Neck ROM: Full ? ? ? Dental ?no notable dental hx. ? ?  ?Pulmonary ?neg pulmonary ROS,  ?COVID 07/15/20 ?  ?Pulmonary exam normal ?breath sounds clear to auscultation ? ? ? ? ? ? Cardiovascular ?negative cardio ROS ?Normal cardiovascular exam ?Rhythm:Regular Rate:Normal ? ? ?  ?Neuro/Psych ?PSYCHIATRIC DISORDERS Depression negative neurological ROS ? negative psych ROS  ? GI/Hepatic ?negative GI ROS, Neg liver ROS,   ?Endo/Other  ?negative endocrine ROSObesity BMI 34 ? Renal/GU ?negative Renal ROS  ?negative genitourinary ?  ?Musculoskeletal ?negative musculoskeletal ROS ?(+) Levoscoliosis, herniated lumbar discs per pt  ? Abdominal ?(+) + obese,   ?Peds ?negative pediatric ROS ?(+)  Hematology ?negative hematology ROS ?(+)   ?Anesthesia Other Findings ? ? Reproductive/Obstetrics ?negative OB ROS ?(+) Pregnancy ? ?  ? ? ? ? ? ? ? ? ? ? ? ? ? ?  ?  ? ? ? ? ? ? ? ? ?Anesthesia Physical ? ?Anesthesia Plan ? ?ASA: II and emergent ? ?Anesthesia Plan: Spinal  ? ?Post-op Pain Management:   ? ?Induction:  ? ?PONV Risk Score and Plan: 2 ? ?Airway Management Planned: Natural Airway ? ?Additional Equipment: None ? ?Intra-op Plan:  ? ?Post-operative Plan:  ? ?Informed Consent: I have reviewed the patients History and Physical, chart, labs and discussed the procedure including the risks, benefits and alternatives for the proposed anesthesia with the patient or authorized representative who has indicated his/her understanding and acceptance.  ? ? ? ? ? ?Plan Discussed with:  ? ?Anesthesia Plan Comments:   ? ? ? ? ? ? ?Anesthesia Quick Evaluation ? ?

## 2021-10-02 ENCOUNTER — Encounter (HOSPITAL_COMMUNITY): Payer: Self-pay | Admitting: Obstetrics & Gynecology

## 2021-10-02 DIAGNOSIS — Z98891 History of uterine scar from previous surgery: Secondary | ICD-10-CM

## 2021-10-02 LAB — CBC
HCT: 30.7 % — ABNORMAL LOW (ref 36.0–46.0)
Hemoglobin: 10.2 g/dL — ABNORMAL LOW (ref 12.0–15.0)
MCH: 27.7 pg (ref 26.0–34.0)
MCHC: 33.2 g/dL (ref 30.0–36.0)
MCV: 83.4 fL (ref 80.0–100.0)
Platelets: 250 10*3/uL (ref 150–400)
RBC: 3.68 MIL/uL — ABNORMAL LOW (ref 3.87–5.11)
RDW: 13.4 % (ref 11.5–15.5)
WBC: 14.9 10*3/uL — ABNORMAL HIGH (ref 4.0–10.5)
nRBC: 0 % (ref 0.0–0.2)

## 2021-10-02 LAB — RPR: RPR Ser Ql: NONREACTIVE

## 2021-10-02 MED ORDER — TETANUS-DIPHTH-ACELL PERTUSSIS 5-2.5-18.5 LF-MCG/0.5 IM SUSY: 0.5000 mL | PREFILLED_SYRINGE | Freq: Once | INTRAMUSCULAR | Status: DC

## 2021-10-02 MED ORDER — ENOXAPARIN SODIUM 40 MG/0.4ML IJ SOSY
40.0000 mg | PREFILLED_SYRINGE | INTRAMUSCULAR | Status: DC
  Administered 2021-10-02: 40 mg via SUBCUTANEOUS
  Filled 2021-10-02 (×2): qty 0.4

## 2021-10-02 MED ORDER — SIMETHICONE 80 MG PO CHEW
80.0000 mg | CHEWABLE_TABLET | Freq: Three times a day (TID) | ORAL | Status: DC
  Administered 2021-10-02 – 2021-10-03 (×5): 80 mg via ORAL
  Filled 2021-10-02 (×5): qty 1

## 2021-10-02 MED ORDER — GABAPENTIN 100 MG PO CAPS
200.0000 mg | ORAL_CAPSULE | Freq: Every day | ORAL | Status: DC
  Administered 2021-10-02: 200 mg via ORAL
  Filled 2021-10-02: qty 2

## 2021-10-02 MED ORDER — CEFOTETAN DISODIUM 2 G IJ SOLR
2.0000 g | INTRAMUSCULAR | Status: DC
  Filled 2021-10-02: qty 2

## 2021-10-02 MED ORDER — PROCHLORPERAZINE EDISYLATE 10 MG/2ML IJ SOLN
10.0000 mg | Freq: Once | INTRAMUSCULAR | Status: DC
  Filled 2021-10-02: qty 2

## 2021-10-02 MED ORDER — ACETAMINOPHEN 500 MG PO TABS: 1000.0000 mg | ORAL_TABLET | ORAL | Status: DC

## 2021-10-02 MED ORDER — MENTHOL 3 MG MT LOZG: 1.0000 | LOZENGE | OROMUCOSAL | Status: DC | PRN

## 2021-10-02 MED ORDER — WITCH HAZEL-GLYCERIN EX PADS
1.0000 | MEDICATED_PAD | CUTANEOUS | Status: DC | PRN
Start: 2021-10-02 — End: 2021-10-03

## 2021-10-02 MED ORDER — DIBUCAINE (PERIANAL) 1 % EX OINT: 1.0000 "application " | TOPICAL_OINTMENT | CUTANEOUS | Status: DC | PRN

## 2021-10-02 MED ORDER — PRENATAL MULTIVITAMIN CH
1.0000 | ORAL_TABLET | Freq: Every day | ORAL | Status: DC
  Administered 2021-10-02: 1 via ORAL
  Filled 2021-10-02 (×2): qty 1

## 2021-10-02 MED ORDER — MAGNESIUM HYDROXIDE 400 MG/5ML PO SUSP: 30.0000 mL | ORAL | Status: DC | PRN

## 2021-10-02 MED ORDER — POVIDONE-IODINE 10 % EX SWAB
2.0000 | Freq: Once | CUTANEOUS | Status: DC
Start: 2021-10-02 — End: 2021-10-02

## 2021-10-02 MED ORDER — NAPROXEN 250 MG PO TABS
500.0000 mg | ORAL_TABLET | Freq: Two times a day (BID) | ORAL | Status: DC
  Administered 2021-10-03: 500 mg via ORAL
  Filled 2021-10-02 (×2): qty 2

## 2021-10-02 MED ORDER — SENNOSIDES-DOCUSATE SODIUM 8.6-50 MG PO TABS
2.0000 | ORAL_TABLET | Freq: Every day | ORAL | Status: DC
  Administered 2021-10-02 – 2021-10-03 (×2): 2 via ORAL
  Filled 2021-10-02 (×2): qty 2

## 2021-10-02 MED ORDER — DIPHENHYDRAMINE HCL 25 MG PO CAPS: 25.0000 mg | ORAL_CAPSULE | Freq: Four times a day (QID) | ORAL | Status: DC | PRN

## 2021-10-02 MED ORDER — MEASLES, MUMPS & RUBELLA VAC IJ SOLR: 0.5000 mL | Freq: Once | INTRAMUSCULAR | Status: DC

## 2021-10-02 MED ORDER — COCONUT OIL OIL: 1.0000 "application " | TOPICAL_OIL | Status: DC | PRN

## 2021-10-02 MED ORDER — GABAPENTIN 100 MG PO CAPS: 300.0000 mg | ORAL_CAPSULE | ORAL | Status: DC

## 2021-10-02 MED ORDER — SIMETHICONE 80 MG PO CHEW: 80.0000 mg | CHEWABLE_TABLET | ORAL | Status: DC | PRN

## 2021-10-02 MED ORDER — MEDROXYPROGESTERONE ACETATE 150 MG/ML IM SUSP: 150.0000 mg | INTRAMUSCULAR | Status: DC | PRN

## 2021-10-02 MED ORDER — LACTATED RINGERS IV SOLN: INTRAVENOUS | Status: DC

## 2021-10-02 MED ORDER — SOD CITRATE-CITRIC ACID 500-334 MG/5ML PO SOLN: 30.0000 mL | ORAL | Status: DC

## 2021-10-02 MED ORDER — KETOROLAC TROMETHAMINE 30 MG/ML IJ SOLN
30.0000 mg | Freq: Four times a day (QID) | INTRAMUSCULAR | Status: AC
  Administered 2021-10-02 (×3): 30 mg via INTRAVENOUS
  Filled 2021-10-02 (×4): qty 1

## 2021-10-02 MED ORDER — OXYCODONE HCL 5 MG PO TABS: 5.0000 mg | ORAL_TABLET | ORAL | Status: DC | PRN

## 2021-10-02 MED ORDER — ACETAMINOPHEN 500 MG PO TABS
1000.0000 mg | ORAL_TABLET | Freq: Four times a day (QID) | ORAL | Status: DC
  Administered 2021-10-02 – 2021-10-03 (×6): 1000 mg via ORAL
  Filled 2021-10-02 (×6): qty 2

## 2021-10-02 MED ORDER — LACTATED RINGERS IV SOLN
INTRAVENOUS | Status: DC
Start: 2021-10-02 — End: 2021-10-02

## 2021-10-02 MED ORDER — OXYTOCIN-SODIUM CHLORIDE 30-0.9 UT/500ML-% IV SOLN: 2.5000 [IU]/h | INTRAVENOUS | Status: AC

## 2021-10-02 NOTE — Progress Notes (Signed)
Subjective: ?Postpartum Day 1: Cesarean Delivery ?Patient reports tolerating PO and + flatus.  Pain is controlled. Catheter is still in place.  ? ?Objective: ?Vital signs in last 24 hours: ?Temp:  [96 ?F (35.6 ?C)-99.1 ?F (37.3 ?C)] 97.8 ?F (36.6 ?C) (04/02 [id number]) ?Pulse Rate:  [69-102] 84 (04/02 0816) ?Resp:  [16-18] 18 (04/02 0816) ?BP: (114-132)/(59-84) 119/59 (04/02 0816) ?SpO2:  [97 %-100 %] 99 % (04/02 0816) ? ?Physical Exam:  ?General: alert, cooperative, and no distress ?Lochia: appropriate ?Uterine Fundus: firm ?Incision: healing well, some areas of bleeding  ?DVT Evaluation: No cords or calf tenderness. ? ?Recent Labs  ?  10/01/21 ?1959 10/02/21 ?[id number]  ?HGB 12.1 10.2*  ?HCT 37.1 30.7*  ? ? ?Assessment/Plan: ?Postpartum ?- Meeting postop goals so far. Catheter will be removed today. Postop Hgb normal ?- Contraception: BTS ?- MOF: breast ?- Rh status: positive ?- Rubella status: immune ?- Dispo: Possible d/c tomorrow to home ?- Consults: None ? ?Neonatal ?- Doing well ?- Circumcision: Desires circ. Circumcision procedure details discussed, risks and benefits of procedure were also discussed.  The benefits include but are not limited to: reduction in the rates of urinary tract infection (UTI), penile cancer, sexually transmitted infections including HIV, penile inflammatory and retractile disorders.  Circumcision also helps obtain better and easier hygiene of the penis.  Risks include but are not limited to: bleeding, infection, injury of glans which may lead to penile deformity or urinary tract issues or Urology intervention, unsatisfactory cosmetic appearance and other potential complications related to the procedure.  It was emphasized that this is an elective procedure.  ? ? ?Rhonda Massey ?10/02/2021, 11:07 AM ?
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
## 2021-10-02 NOTE — Anesthesia Postprocedure Evaluation (Signed)
Anesthesia Post Note ? ?Patient: Rhonda Massey ? ?Procedure(s) Performed: CESAREAN SECTION WITH BILATERAL TUBAL LIGATION ? ?  ? ?Patient location during evaluation: PACU ?Anesthesia Type: Spinal ?Level of consciousness: awake and alert ?Pain management: pain level controlled ?Vital Signs Assessment: post-procedure vital signs reviewed and stable ?Respiratory status: spontaneous breathing, nonlabored ventilation and respiratory function stable ?Cardiovascular status: blood pressure returned to baseline and stable ?Postop Assessment: no apparent nausea or vomiting ?Anesthetic complications: no ? ? ?No notable events documented. ? ?Last Vitals:  ?Vitals:  ? 10/01/21 2345 10/02/21 0015  ?BP: 131/77 130/80  ?Pulse: 76 78  ?Resp: 18 18  ?Temp: (!) 35.6 ?C (!) 36.1 ?C  ?SpO2: 97% 100%  ?  ?Last Pain:  ?Vitals:  ? 10/02/21 0015  ?TempSrc: Axillary  ?PainSc: 2   ? ?Pain Goal: Patients Stated Pain Goal: 0 (10/01/21 2315) ? ?  ?  ?  ?  ?  ?  ?Epidural/Spinal Function Cutaneous sensation: Tingles (10/02/21 0015), Patient able to flex knees: Yes (10/02/21 0015), Patient able to lift hips off bed: Yes (10/02/21 0015), Back pain beyond tenderness at insertion site: No (10/02/21 0015), Progressively worsening motor and/or sensory loss: No (10/02/21 0015), Bowel and/or bladder incontinence post epidural: No (10/02/21 0015) ? ?Rhonda Massey ? ? ? ? ?

## 2021-10-02 NOTE — Lactation Note (Signed)
This note was copied from a baby's chart. ?Lactation Consultation Note ? ?Patient Name: Rhonda Massey ?Today's Date: 10/02/2021 ?Reason for consult: Initial assessment;Early term 37-38.6wks;Infant weight loss ?Age:23 hours ? ?Visited with mom of 64 hours old ETI female, she's a P2 and experienced breastfeeding. She voiced she's been leaking colostrum since she was 32 weeks through this pregnancy. Mom already nursing baby when entered the room (see LATCH score) she reports breastfeeding is going well and she can see baby dropping his jaw during nutritive sucking. Baby started to fall asleep during [facility] consultation, but he was still feeding on and off.  ? ?Reviewed normal newborn behavior, lactogenesis II, size of baby's stomach, feeding cues and cluster feeding. Rhonda Massey doesn't have a pump for home use, she asked for a hand pump which it was provided to her. ? ?Maternal Data ?Has patient been taught Hand Expression?: Yes ?Does the patient have breastfeeding experience prior to this delivery?: Yes ?How long did the patient breastfeed?: 6 months ? ?Feeding ?Mother's Current Feeding Choice: Breast Milk ? ?LATCH Score ?Latch: Grasps breast easily, tongue down, lips flanged, rhythmical sucking. ? ?Audible Swallowing: A few with stimulation ? ?Type of Nipple: Everted at rest and after stimulation ? ?Comfort (Breast/Nipple): Soft / non-tender ? ?Hold (Positioning): No assistance needed to correctly position infant at breast. ? ?LATCH Score: 9 ? ?Lactation Tools Discussed/Used ?Tools: Pump ?Breast pump type: Manual ?Pump Education: Setup, frequency, and cleaning;Milk Storage ?Reason for Pumping: mother's request ?Pumping frequency: PRN ? ?Interventions ?Interventions: Breast feeding basics reviewed;Hand pump;Education;LC Services brochure;Support pillows ? ?Plan of care ?Encouraged mom to feed baby 8-12 times/24 hours or sooner if feeding cues are present ?Hand expression was also encouraged ? ?Rhonda Massey present. All  questions and concerns answered, parents to contact LC services PRN. ? ?Discharge ?Pump: Manual ? ?Consult Status ?Consult Status: Follow-up ?Date: 10/03/21 ?Follow-up type: In-patient ? ? ?Dvon Jiles S Duaa Stelzner ?10/02/2021, 4:25 PM ? ? ? ?

## 2021-10-02 NOTE — Lactation Note (Signed)
This note was copied from a baby's chart. ?Lactation Consultation Note ? ?Patient Name: Rhonda Massey ?Today's Date: 10/02/2021 ?  ?Age:23 hours ?Per RN Judeth Cornfield) mom would like to be seen by [facility] services in morning, mom is having emesis tonight and not feeling well.  ?Maternal Data ?  ? ?Feeding ?  ? ?LATCH Score ?Latch: Repeated attempts needed to sustain latch, nipple held in mouth throughout feeding, stimulation needed to elicit sucking reflex. ? ?Audible Swallowing: Spontaneous and intermittent ? ?Type of Nipple: Everted at rest and after stimulation ? ?Comfort (Breast/Nipple): Soft / non-tender ? ?Hold (Positioning): Assistance needed to correctly position infant at breast and maintain latch. ? ?LATCH Score: 8 ? ? ?Lactation Tools Discussed/Used ?  ? ?Interventions ?Interventions: Assisted with latch;Skin to skin ? ?Discharge ?  ? ?Consult Status ?  ? ? ? ?Danelle Earthly ?10/02/2021, 2:35 AM ? ? ? ?

## 2021-10-03 ENCOUNTER — Encounter (HOSPITAL_COMMUNITY): Payer: Self-pay | Admitting: Obstetrics & Gynecology

## 2021-10-03 MED ORDER — ACETAMINOPHEN 500 MG PO TABS: 1000.0000 mg | ORAL_TABLET | Freq: Four times a day (QID) | ORAL | 0 refills | Status: AC

## 2021-10-03 MED ORDER — DOCUSATE SODIUM 100 MG PO CAPS: 100.0000 mg | ORAL_CAPSULE | Freq: Two times a day (BID) | ORAL | 2 refills | Status: DC | PRN

## 2021-10-03 MED ORDER — OXYCODONE HCL 5 MG PO TABS: 5.0000 mg | ORAL_TABLET | ORAL | 0 refills | Status: AC | PRN

## 2021-10-03 MED ORDER — SIMETHICONE 80 MG PO CHEW: 80.0000 mg | CHEWABLE_TABLET | Freq: Three times a day (TID) | ORAL | 0 refills | Status: DC

## 2021-10-03 NOTE — Lactation Note (Addendum)
This note was copied from a baby's chart. ?Lactation Consultation Note ? ?Patient Name: Rhonda Massey ?Today's Date: 10/03/2021 ?Reason for consult: Follow-up assessment ?Age:23 hours ? ?Mom said that breastfeeding seems to be going well; she had no questions or concerns. ? ?Consult Status ?Consult Status: Complete ? ? ?Lurline Hare LaBelle ?10/03/2021, 10:19 AM ? ? ? ?

## 2021-10-04 ENCOUNTER — Encounter (HOSPITAL_COMMUNITY)
Admission: RE | Admit: 2021-10-04 | Discharge: 2021-10-04 | Disposition: A | Discharge status: Home / Self Care | Payer: Medicaid Other | Source: Ambulatory Visit | Attending: Obstetrics and Gynecology | Admitting: Obstetrics and Gynecology

## 2021-10-05 ENCOUNTER — Encounter: Payer: Medicaid Other | Admitting: Obstetrics and Gynecology

## 2021-10-06 DIAGNOSIS — Z98891 History of uterine scar from previous surgery: Secondary | ICD-10-CM

## 2021-10-10 ENCOUNTER — Ambulatory Visit (INDEPENDENT_AMBULATORY_CARE_PROVIDER_SITE_OTHER): Payer: Medicaid Other

## 2021-10-10 VITALS — BP 116/81 | HR 102 | Wt 181.0 lb

## 2021-10-10 DIAGNOSIS — Z98891 History of uterine scar from previous surgery: Secondary | ICD-10-CM

## 2021-10-10 NOTE — Progress Notes (Addendum)
Subjective:  ?  ? Rhonda Massey is a 21 y.o. female who presents to the clinic 1 weeks status post low uterine, transverse cesarean section. Pt reports incision is healing well.  ? ? ?  ?Objective:  ? ? There were no vitals taken for this visit. ?General:  alert, well appearing, in no apparent distress  ?Incision:   healing well, no drainage, no erythema, no hernia, no seroma, no swelling, no dehiscence, incision well approximated  ?   ?Assessment:  ? ? Doing well postoperatively. ?  ?Plan:  ? ? 1. Continue any current medications. ?2. Wound care discussed. ?3. Follow up: Post partum appt  on 11/02/21.  ? ?Kennon Portela, CMA  ?

## 2021-11-02 ENCOUNTER — Encounter: Payer: Self-pay | Admitting: Family Medicine

## 2021-11-02 ENCOUNTER — Ambulatory Visit (INDEPENDENT_AMBULATORY_CARE_PROVIDER_SITE_OTHER): Payer: Medicaid Other | Admitting: Family Medicine

## 2021-11-02 DIAGNOSIS — O900 Disruption of cesarean delivery wound: Secondary | ICD-10-CM | POA: Diagnosis not present

## 2021-11-02 NOTE — Progress Notes (Signed)
? ? ?Post Partum Visit Note ? ?Rhonda Massey is a 21 y.o. [phone] female who presents for a postpartum visit. She is 4 weeks postpartum following a repeat cesarean section.  I have fully reviewed the prenatal and intrapartum course. The delivery was at 38 gestational weeks.  Anesthesia: spinal. Postpartum course has been unremarkable. Baby is doing well. Baby is feeding by both breast and bottle - Gerber Gentle . Bleeding staining only. Bowel function is normal. Bladder function is normal. Patient is not sexually active. Contraception method is tubal ligation. Postpartum depression screening: negative. ? ? ?The pregnancy intention screening data noted above was reviewed. Potential methods of contraception were discussed. The patient elected to proceed with No data recorded. ? ? Edinburgh Postnatal Depression Scale - 11/02/21 1604   ? ?  ? Edinburgh Postnatal Depression Scale:  In the Past 7 Days  ? I have been able to laugh and see the funny side of things. 0   ? I have looked forward with enjoyment to things. 0   ? I have blamed myself unnecessarily when things went wrong. 0   ? I have been anxious or worried for no good reason. 1   ? I have felt scared or panicky for no good reason. 0   ? Things have been getting on top of me. 1   ? I have been so unhappy that I have had difficulty sleeping. 0   ? I have felt sad or miserable. 0   ? I have been so unhappy that I have been crying. 0   ? The thought of harming myself has occurred to me. 0   ? Edinburgh Postnatal Depression Scale Total 2   ? ?  ?  ? ?  ? ? ?Health Maintenance Due  ?Topic Date Due  ? COVID-19 Vaccine (1) Never done  ? HPV VACCINES (1 - 2-dose series) Never done  ? ? ?The following portions of the patient's history were reviewed and updated as appropriate: allergies, current medications, past family history, past medical history, past social history, past surgical history, and problem list. ? ?Review of Systems ?Pertinent items noted in HPI  and remainder of comprehensive ROS otherwise negative. ? ?Objective:  ?BP 131/85   Pulse 98   Wt 181 lb (82.1 kg)   Breastfeeding Yes   BMI 35.35 kg/m?   ? ?General:  alert, cooperative, and appears stated age  ?Lungs: Normal effort  ?Heart:  regular rate and rhythm  ?Abdomen: soft, non-tender; bowel sounds normal; no masses,  no organomegaly   ?Wound wound edges not approximated - treated with AgNO3  ?     ?Assessment:  ? ? There are no diagnoses linked to this encounter. ? ?Normal postpartum exam.  ? ?Plan:  ? ?Essential components of care per ACOG recommendations: ? ?1.  Mood and well being: Patient with negative depression screening today. Reviewed local resources for support.  ?- Patient tobacco use? No.   ?- hx of drug use? No.   ? ?2. Infant care and feeding:  ?-Patient currently breastmilk feeding? Yes. Discussed returning to work and pumping. Reviewed importance of draining breast regularly to support lactation.  ?-Social determinants of health (SDOH) reviewed in EPIC. No concerns ? ?3. Sexuality, contraception and birth spacing ?- Patient does not want a pregnancy in the next year.  Desired family size is 2 children.  ?- Reviewed reproductive life planning. Reviewed contraceptive methods based on pt preferences and effectiveness.  Patient desired Female Sterilization today.   ?-  Discussed birth spacing of 18 months ? ?4. Sleep and fatigue ?-Encouraged family/partner/community support of 4 hrs of uninterrupted sleep to help with mood and fatigue ? ?5. Physical Recovery  ?- Discussed patients delivery and complications. She describes her labor as good. ?- Patient had a C-section repeat; no problems after deliver.  Perineal healing reviewed. Patient expressed understanding ?- Patient has urinary incontinence? No. ?- Patient is safe to resume physical and sexual activity ? ?6.  Health Maintenance ?- HM due items addressed Yes ?- Last pap smear No results found for: DIAGPAP Pap smear not done at today's  visit.  ?-Breast Cancer screening indicated? No.  ? ?7. Chronic Disease/Pregnancy Condition follow up: None ? ?- PCP follow up ? ?Reva Bores, MD ?Center for [facility], [facility]  ?

## 2021-12-06 ENCOUNTER — Encounter: Payer: Self-pay | Admitting: *Deleted

## 2022-01-08 ENCOUNTER — Encounter (HOSPITAL_COMMUNITY): Payer: Self-pay | Admitting: *Deleted

## 2022-01-08 ENCOUNTER — Other Ambulatory Visit: Payer: Self-pay

## 2022-01-08 ENCOUNTER — Ambulatory Visit (HOSPITAL_COMMUNITY)
Admission: EM | Admit: 2022-01-08 | Discharge: 2022-01-08 | Disposition: A | Discharge status: Home / Self Care | Payer: Medicaid Other | Attending: Internal Medicine | Admitting: Internal Medicine

## 2022-01-08 DIAGNOSIS — S81811A Laceration without foreign body, right lower leg, initial encounter: Secondary | ICD-10-CM | POA: Diagnosis not present

## 2022-01-08 MED ORDER — LIDOCAINE-EPINEPHRINE 1 %-1:100000 IJ SOLN
INTRAMUSCULAR | Status: AC
  Filled 2022-01-08: qty 1
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
## 2022-01-08 NOTE — ED Triage Notes (Signed)
Pt reports slipping on wet grass today and has a lac to rt lower leg . Pt reports she hit a window unit that was on the ground.

## 2022-01-08 NOTE — Discharge Instructions (Addendum)
Change the dressing to your wound twice daily for the next 7 days until you get your sutures removed. Return to the clinic in 7 days for suture removal. Keep the wound dry for 24 hours, then you may wash it with soap and water gently.  Do not scrub the wound as this can cause bleeding. If you notice any redness, swelling, pus, or increasingly worse pain to the area, please return sooner than 7 days to have your wound evaluated as it could be infected at that time.  If you develop any new or worsening symptoms or do not improve in the next 2 to 3 days, please return.  If your symptoms are severe, please go to the emergency room.  Follow-up with your primary care provider for further evaluation and management of your symptoms as well as ongoing wellness visits.  I hope you feel better!

## 2022-01-19 ENCOUNTER — Encounter: Payer: Self-pay | Admitting: Obstetrics and Gynecology

## 2022-12-18 ENCOUNTER — Emergency Department (HOSPITAL_COMMUNITY): Payer: BLUE CROSS/BLUE SHIELD

## 2022-12-18 ENCOUNTER — Other Ambulatory Visit: Payer: Self-pay

## 2022-12-18 ENCOUNTER — Emergency Department (HOSPITAL_COMMUNITY)
Admission: EM | Admit: 2022-12-18 | Discharge: 2022-12-18 | Disposition: A | Discharge status: Home / Self Care | Payer: BLUE CROSS/BLUE SHIELD | Attending: Emergency Medicine | Admitting: Emergency Medicine

## 2022-12-18 DIAGNOSIS — S20219A Contusion of unspecified front wall of thorax, initial encounter: Secondary | ICD-10-CM

## 2022-12-18 DIAGNOSIS — I499 Cardiac arrhythmia, unspecified: Secondary | ICD-10-CM | POA: Diagnosis not present

## 2022-12-18 DIAGNOSIS — R519 Headache, unspecified: Secondary | ICD-10-CM | POA: Diagnosis not present

## 2022-12-18 DIAGNOSIS — R071 Chest pain on breathing: Secondary | ICD-10-CM | POA: Diagnosis present

## 2022-12-18 DIAGNOSIS — S20212A Contusion of left front wall of thorax, initial encounter: Secondary | ICD-10-CM | POA: Diagnosis not present

## 2022-12-18 DIAGNOSIS — M791 Myalgia, unspecified site: Secondary | ICD-10-CM | POA: Insufficient documentation

## 2022-12-18 DIAGNOSIS — R109 Unspecified abdominal pain: Secondary | ICD-10-CM | POA: Insufficient documentation

## 2022-12-18 DIAGNOSIS — Y92410 Unspecified street and highway as the place of occurrence of the external cause: Secondary | ICD-10-CM | POA: Insufficient documentation

## 2022-12-18 DIAGNOSIS — R079 Chest pain, unspecified: Secondary | ICD-10-CM | POA: Diagnosis not present

## 2022-12-18 DIAGNOSIS — T07XXXA Unspecified multiple injuries, initial encounter: Secondary | ICD-10-CM | POA: Diagnosis not present

## 2022-12-18 DIAGNOSIS — M542 Cervicalgia: Secondary | ICD-10-CM | POA: Diagnosis not present

## 2022-12-18 LAB — I-STAT CHEM 8, ED
BUN: 8 mg/dL (ref 6–20)
Calcium, Ion: 1.14 mmol/L — ABNORMAL LOW (ref 1.15–1.40)
Chloride: 108 mmol/L (ref 98–111)
Creatinine, Ser: 0.5 mg/dL (ref 0.44–1.00)
Glucose, Bld: 117 mg/dL — ABNORMAL HIGH (ref 70–99)
HCT: 38 % (ref 36.0–46.0)
Hemoglobin: 12.9 g/dL (ref 12.0–15.0)
Potassium: 3.7 mmol/L (ref 3.5–5.1)
Sodium: 140 mmol/L (ref 135–145)
TCO2: 23 mmol/L (ref 22–32)

## 2022-12-18 LAB — I-STAT BETA HCG BLOOD, ED (MC, WL, AP ONLY): I-stat hCG, quantitative: <5 m[IU]/mL (ref <5)

## 2022-12-18 MED ORDER — ACETAMINOPHEN 500 MG PO TABS
ORAL_TABLET | ORAL | Status: AC
  Filled 2022-12-18: qty 1

## 2022-12-18 MED ORDER — IOHEXOL 350 MG/ML SOLN
75.0000 mL | Freq: Once | INTRAVENOUS | Status: AC | PRN
  Administered 2022-12-18: 75 mL via INTRAVENOUS

## 2022-12-18 MED ORDER — CYCLOBENZAPRINE HCL 10 MG PO TABS: 10.0000 mg | ORAL_TABLET | Freq: Two times a day (BID) | ORAL | 0 refills | Status: DC | PRN

## 2022-12-18 MED ORDER — ACETAMINOPHEN 325 MG PO TABS: 650.0000 mg | ORAL_TABLET | Freq: Four times a day (QID) | ORAL | 0 refills | Status: DC | PRN

## 2022-12-18 MED ORDER — ACETAMINOPHEN 500 MG PO TABS
1000.0000 mg | ORAL_TABLET | Freq: Once | ORAL | Status: AC
  Administered 2022-12-18: 1000 mg via ORAL
  Filled 2022-12-18: qty 2
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
## 2023-02-05 ENCOUNTER — Other Ambulatory Visit: Payer: Self-pay

## 2023-02-05 ENCOUNTER — Ambulatory Visit
Admission: RE | Admit: 2023-02-05 | Discharge: 2023-02-05 | Disposition: A | Discharge status: Home / Self Care | Payer: BLUE CROSS/BLUE SHIELD | Source: Ambulatory Visit | Attending: Emergency Medicine | Admitting: Emergency Medicine

## 2023-02-05 VITALS — BP 113/72 | HR 107 | Temp 98.9°F | Resp 18

## 2023-02-05 DIAGNOSIS — J029 Acute pharyngitis, unspecified: Secondary | ICD-10-CM | POA: Insufficient documentation

## 2023-02-05 DIAGNOSIS — J040 Acute laryngitis: Secondary | ICD-10-CM | POA: Diagnosis not present

## 2023-02-05 LAB — POCT RAPID STREP A (OFFICE): Rapid Strep A Screen: NEGATIVE
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
## 2023-02-05 NOTE — Discharge Instructions (Addendum)
Most likely this is a viral illness no antibiotics are indicated at present , rest, push fluids, may take over-the-counter Tylenol/ibuprofen/Chloraseptic/Mucinex as label directed for symptom management.  Your strep test was negative, culture is pending.  You will be notified if culture is positive and further treatment is needed.  Follow-up with PCP.  Return as needed.

## 2023-02-05 NOTE — ED Triage Notes (Signed)
Pt reports sore throat for 3 days.

## 2023-02-11 ENCOUNTER — Ambulatory Visit
Admission: EM | Admit: 2023-02-11 | Discharge: 2023-02-11 | Disposition: A | Discharge status: Home / Self Care | Payer: BLUE CROSS/BLUE SHIELD | Attending: Internal Medicine | Admitting: Internal Medicine

## 2023-02-11 ENCOUNTER — Other Ambulatory Visit: Payer: Self-pay

## 2023-02-11 ENCOUNTER — Encounter: Payer: Self-pay | Admitting: Emergency Medicine

## 2023-02-11 DIAGNOSIS — J029 Acute pharyngitis, unspecified: Secondary | ICD-10-CM | POA: Insufficient documentation

## 2023-02-11 LAB — POCT MONO SCREEN (KUC): Mono, POC: NEGATIVE

## 2023-02-11 LAB — POCT RAPID STREP A (OFFICE): Rapid Strep A Screen: NEGATIVE

## 2023-02-11 MED ORDER — AMOXICILLIN 500 MG PO CAPS: 500.0000 mg | ORAL_CAPSULE | Freq: Two times a day (BID) | ORAL | 0 refills | Status: AC
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
## 2023-02-11 NOTE — ED Triage Notes (Signed)
Seen 8/5.  Was tested for strep.  Strep negative and culture negative per patient.  No medicines prescribed for issue.  Has taken otc meds for sinus, tylenol, ibuprofen.  Reports pain and cough is worse.   Denies sick contacts.

## 2023-02-11 NOTE — Discharge Instructions (Signed)
rapid strep and rapid mono were negative.  Throat culture is pending.  Will call if it is abnormal. I have prescribed an antibiotic to see if this will be helpful.  If symptoms persist or worsen, please follow-up with your primary care doctor.

## 2023-02-11 NOTE — ED Notes (Signed)
Patient discharged by this nurse 

## 2023-05-12 ENCOUNTER — Encounter (HOSPITAL_COMMUNITY): Payer: Self-pay

## 2023-05-12 ENCOUNTER — Ambulatory Visit (HOSPITAL_COMMUNITY)
Admission: EM | Admit: 2023-05-12 | Discharge: 2023-05-12 | Disposition: A | Discharge status: Home / Self Care | Payer: BLUE CROSS/BLUE SHIELD | Attending: Internal Medicine | Admitting: Internal Medicine

## 2023-05-12 DIAGNOSIS — S51812A Laceration without foreign body of left forearm, initial encounter: Secondary | ICD-10-CM | POA: Diagnosis not present

## 2023-05-12 MED ORDER — DOXYCYCLINE HYCLATE 100 MG PO CAPS: 100.0000 mg | ORAL_CAPSULE | Freq: Two times a day (BID) | ORAL | 0 refills | Status: AC
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
## 2023-05-12 NOTE — ED Triage Notes (Signed)
Patient here today with c/o laceration to left forearm today at 3:30 pm. Patient was moving sheet metal and it slipped and cut her arm. She is up to date on her tetanus vaccine.

## 2023-05-12 NOTE — Discharge Instructions (Addendum)
Leave dry dressing in place until tomorrow then may remove and replace with a another clean dry dressing.  Change this daily.  Okay to wash the area with soap and water after 3 days.  Keep covered in the shower until then.  Follow-up in 7 to 10 days for suture removal  Doxycycline 100mg  twice daily for 3 days

## 2023-11-13 ENCOUNTER — Ambulatory Visit (INDEPENDENT_AMBULATORY_CARE_PROVIDER_SITE_OTHER): Admitting: General Practice

## 2023-11-13 ENCOUNTER — Encounter: Payer: Self-pay | Admitting: General Practice

## 2023-11-13 VITALS — BP 90/60 | HR 101 | Temp 98.5°F | Ht 61.75 in | Wt 183.0 lb

## 2023-11-13 DIAGNOSIS — F419 Anxiety disorder, unspecified: Secondary | ICD-10-CM

## 2023-11-13 DIAGNOSIS — E66811 Obesity, class 1: Secondary | ICD-10-CM | POA: Diagnosis not present

## 2023-11-13 DIAGNOSIS — F32A Depression, unspecified: Secondary | ICD-10-CM

## 2023-11-13 DIAGNOSIS — Z7689 Persons encountering health services in other specified circumstances: Secondary | ICD-10-CM | POA: Insufficient documentation

## 2023-11-13 DIAGNOSIS — Z6833 Body mass index (BMI) 33.0-33.9, adult: Secondary | ICD-10-CM | POA: Diagnosis not present

## 2023-11-13 DIAGNOSIS — E6609 Other obesity due to excess calories: Secondary | ICD-10-CM | POA: Diagnosis not present

## 2023-11-13 LAB — CBC
HCT: 40.1 % (ref 36.0–46.0)
Hemoglobin: 13.6 g/dL (ref 12.0–15.0)
MCHC: 33.9 g/dL (ref 30.0–36.0)
MCV: 85.8 fl (ref 78.0–100.0)
Platelets: 291 10*3/uL (ref 150.0–400.0)
RBC: 4.67 Mil/uL (ref 3.87–5.11)
RDW: 12.4 % (ref 11.5–15.5)
WBC: 4.7 10*3/uL (ref 4.0–10.5)

## 2023-11-13 LAB — COMPREHENSIVE METABOLIC PANEL WITH GFR
ALT: 18 U/L (ref 0–35)
AST: 17 U/L (ref 0–37)
Albumin: 4.7 g/dL (ref 3.5–5.2)
Alkaline Phosphatase: 56 U/L (ref 39–117)
BUN: 13 mg/dL (ref 6–23)
CO2: 26 meq/L (ref 19–32)
Calcium: 9.5 mg/dL (ref 8.4–10.5)
Chloride: 104 meq/L (ref 96–112)
Creatinine, Ser: 0.61 mg/dL (ref 0.40–1.20)
GFR: 124.59 mL/min (ref >60.00)
Glucose, Bld: 86 mg/dL (ref 70–99)
Potassium: 4.2 meq/L (ref 3.5–5.1)
Sodium: 139 meq/L (ref 135–145)
Total Bilirubin: 1.1 mg/dL (ref 0.2–1.2)
Total Protein: 7.4 g/dL (ref 6.0–8.3)

## 2023-11-13 LAB — LIPID PANEL
Cholesterol: 224 mg/dL — ABNORMAL HIGH (ref 0–200)
HDL: 45.2 mg/dL (ref >39.00)
LDL Cholesterol: 163 mg/dL — ABNORMAL HIGH (ref 0–99)
NonHDL: 179.08
Total CHOL/HDL Ratio: 5
Triglycerides: 80 mg/dL (ref 0.0–149.0)
VLDL: 16 mg/dL (ref 0.0–40.0)

## 2023-11-13 LAB — VITAMIN D 25 HYDROXY (VIT D DEFICIENCY, FRACTURES): VITD: 16.1 ng/mL — ABNORMAL LOW (ref 30.00–100.00)

## 2023-11-13 LAB — TSH: TSH: 1.55 u[IU]/mL (ref 0.35–5.50)

## 2023-11-13 LAB — HEMOGLOBIN A1C: Hgb A1c MFr Bld: 5.2 % (ref 4.6–6.5)

## 2023-11-13 MED ORDER — BUSPIRONE HCL 5 MG PO TABS
5.0000 mg | ORAL_TABLET | Freq: Two times a day (BID) | ORAL | 0 refills | Status: DC
Start: 2023-11-13 — End: 2023-12-11
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
## 2023-11-13 NOTE — Assessment & Plan Note (Signed)
 Uncontrolled.     11/13/2023   11:24 AM 09/07/2021    8:30 AM 03/22/2021    4:16 PM  GAD 7 : Generalized Anxiety Score  Nervous, Anxious, on Edge 2 1 0  Control/stop worrying 2 0 0  Worry too much - different things 2 0 0  Trouble relaxing 3 3 1   Restless 3 3 0  Easily annoyed or irritable 3 2 2   Afraid - awful might happen 1 0 0  Total GAD 7 Score 16 9 3   Anxiety Difficulty Somewhat difficult         11/13/2023   11:24 AM 09/07/2021    8:29 AM 03/22/2021    4:15 PM  PHQ9 SCORE ONLY  PHQ-9 Total Score 19 1 6    Continue zoloft 200 mg once daily.  Start Buspar 5 mg BID. Rx sent.  Follow up in 4 weeks. Declines therapy at this time.

## 2023-11-13 NOTE — Assessment & Plan Note (Signed)
 EMR reviewed briefly.

## 2023-11-13 NOTE — Assessment & Plan Note (Signed)
 Long discussion about weight loss, diet, and exercise Recommended diet heavy in fruits and veggies and low in animal meats, cheeses, and dairy products, appropriate calorie intake Patient will work on decreasing saturated fats and simple carbs  Increase lean proteins and activity.   Will check labs to rule out metabolic cause. Await results.

## 2023-11-13 NOTE — Patient Instructions (Addendum)
 Continue sertraline 200 mg once daily.  Start Buspar 5 mg twice daily. Prescription sent.   Call OBGYN and schedule pap smear.  Follow up in 4 weeks for weight management; anxiety and depression.   It was a pleasure to meet you today! Please don't hesitate to contact me with any questions. Welcome to [facility]!

## 2023-11-14 ENCOUNTER — Ambulatory Visit: Payer: Self-pay | Admitting: General Practice

## 2023-11-14 ENCOUNTER — Other Ambulatory Visit: Payer: Self-pay | Admitting: General Practice

## 2023-11-14 DIAGNOSIS — E559 Vitamin D deficiency, unspecified: Secondary | ICD-10-CM

## 2023-11-14 MED ORDER — VITAMIN D (ERGOCALCIFEROL) 1.25 MG (50000 UNIT) PO CAPS
50000.0000 [IU] | ORAL_CAPSULE | ORAL | 0 refills | Status: AC
Start: 2023-11-14 — End: ?

## 2023-12-11 ENCOUNTER — Ambulatory Visit: Admitting: General Practice

## 2023-12-11 ENCOUNTER — Ambulatory Visit (INDEPENDENT_AMBULATORY_CARE_PROVIDER_SITE_OTHER): Admitting: General Practice

## 2023-12-11 ENCOUNTER — Encounter: Payer: Self-pay | Admitting: General Practice

## 2023-12-11 VITALS — BP 98/60 | HR 82 | Temp 98.4°F | Ht 61.75 in | Wt 189.0 lb

## 2023-12-11 DIAGNOSIS — E782 Mixed hyperlipidemia: Secondary | ICD-10-CM | POA: Insufficient documentation

## 2023-12-11 DIAGNOSIS — F419 Anxiety disorder, unspecified: Secondary | ICD-10-CM | POA: Diagnosis not present

## 2023-12-11 DIAGNOSIS — F32A Depression, unspecified: Secondary | ICD-10-CM

## 2023-12-11 DIAGNOSIS — E66811 Obesity, class 1: Secondary | ICD-10-CM | POA: Diagnosis not present

## 2023-12-11 DIAGNOSIS — E6609 Other obesity due to excess calories: Secondary | ICD-10-CM

## 2023-12-11 DIAGNOSIS — Z6833 Body mass index (BMI) 33.0-33.9, adult: Secondary | ICD-10-CM

## 2023-12-11 MED ORDER — BUSPIRONE HCL 10 MG PO TABS
10.0000 mg | ORAL_TABLET | Freq: Two times a day (BID) | ORAL | 0 refills | Status: DC
Start: 2023-12-11 — End: 2024-02-13

## 2023-12-11 MED ORDER — SERTRALINE HCL 100 MG PO TABS
200.0000 mg | ORAL_TABLET | Freq: Every day | ORAL | 0 refills | Status: DC
Start: 2023-12-11 — End: 2024-03-17

## 2023-12-11 NOTE — Assessment & Plan Note (Signed)
 Long discussion about weight loss, diet, and exercise Recommended diet heavy in fruits and veggies and low in animal meats, cheeses, and dairy products, appropriate calorie intake Patient will work on decreasing saturated fats and simple carbs  Increase lean proteins and activity  Referral placed for healthy weight and wellness.

## 2023-12-11 NOTE — Assessment & Plan Note (Signed)
 Reviewed labs with patient.  Discussed diet and exercise.   Handout provided.   Will check lipid panel in 6 months.

## 2023-12-11 NOTE — Patient Instructions (Addendum)
 Continue Zoloft 200 mg at bedtime .  Increase Buspar  to 10 mg twice a day. New prescription sent.   You will either be contacted via phone regarding your referral to psychiatrist and healthy weight and wellness, or you may receive a letter on your MyChart portal from our referral team with instructions for scheduling an appointment. Please let us  know if you have not been contacted by anyone within two weeks.  Follow up in 2 months.   It was a pleasure to see you today!

## 2023-12-11 NOTE — Assessment & Plan Note (Signed)
 Uncontrolled.   Continue Sertraline 200 mg once daily at bedtime. Rx sent. Increase Buspar  from 5 mg to 10 mg BID. Rx sent.   Referral placed for psychiatry.  F/u in 2 months.
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
## 2023-12-20 ENCOUNTER — Institutional Professional Consult (permissible substitution): Admitting: Nurse Practitioner

## 2024-01-19 ENCOUNTER — Other Ambulatory Visit: Payer: Self-pay | Admitting: General Practice

## 2024-01-19 DIAGNOSIS — F419 Anxiety disorder, unspecified: Secondary | ICD-10-CM

## 2024-01-22 DIAGNOSIS — H5213 Myopia, bilateral: Secondary | ICD-10-CM | POA: Diagnosis not present

## 2024-02-04 ENCOUNTER — Other Ambulatory Visit: Payer: Self-pay | Admitting: General Practice

## 2024-02-04 DIAGNOSIS — E559 Vitamin D deficiency, unspecified: Secondary | ICD-10-CM
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
## 2024-02-11 ENCOUNTER — Ambulatory Visit: Admitting: General Practice

## 2024-02-13 ENCOUNTER — Encounter (HOSPITAL_COMMUNITY): Payer: Self-pay | Admitting: Psychiatry

## 2024-02-13 ENCOUNTER — Ambulatory Visit (HOSPITAL_COMMUNITY): Admitting: Psychiatry

## 2024-02-13 VITALS — BP 112/78 | Wt 187.0 lb

## 2024-02-13 DIAGNOSIS — F329 Major depressive disorder, single episode, unspecified: Secondary | ICD-10-CM | POA: Insufficient documentation

## 2024-02-13 DIAGNOSIS — F331 Major depressive disorder, recurrent, moderate: Secondary | ICD-10-CM

## 2024-02-13 DIAGNOSIS — F411 Generalized anxiety disorder: Secondary | ICD-10-CM | POA: Insufficient documentation

## 2024-02-13 DIAGNOSIS — F419 Anxiety disorder, unspecified: Secondary | ICD-10-CM

## 2024-02-13 DIAGNOSIS — G4700 Insomnia, unspecified: Secondary | ICD-10-CM | POA: Insufficient documentation

## 2024-02-13 DIAGNOSIS — F32A Depression, unspecified: Secondary | ICD-10-CM

## 2024-02-13 MED ORDER — FLUOXETINE HCL 10 MG PO CAPS: 10.0000 mg | ORAL_CAPSULE | Freq: Every day | ORAL | 0 refills | Status: DC

## 2024-02-13 MED ORDER — TRAZODONE HCL 50 MG PO TABS
25.0000 mg | ORAL_TABLET | Freq: Every day | ORAL | 0 refills | Status: AC
Start: 2024-02-13 — End: ?

## 2024-02-13 MED ORDER — HYDROXYZINE HCL 10 MG PO TABS: 10.0000 mg | ORAL_TABLET | Freq: Every day | ORAL | 0 refills | Status: DC | PRN

## 2024-02-13 MED ORDER — FLUOXETINE HCL 20 MG PO CAPS: 20.0000 mg | ORAL_CAPSULE | Freq: Every day | ORAL | 0 refills | Status: DC

## 2024-02-13 NOTE — Addendum Note (Signed)
 Addended by: CARVIN CROCK on: 02/13/2024 03:33 PM   Modules accepted: Level of Service

## 2024-02-13 NOTE — Patient Instructions (Addendum)
 Thank you for attending your appointment today.  -- start trazodone  as needed for sleep and hydroxyzine  as needed for anxiety -- start decreasing zoloft  200 mg to 150 mg for one week then to 100 mg for one week and then 50 mg for one week and then stop -- start prozac  10 mg for two weeks on 8/20 and then if tolerating increase to 20 mg afterwards -- Continue other medications as prescribed.  Please do not make any changes to medications without first discussing with your provider. If you are experiencing a psychiatric emergency, please call 911 or present to your nearest emergency department. Additional crisis, medication management, and therapy resources are included below.  [facility]  [address], [place], [place] [address] [phone] WALK-IN URGENT CARE 24/7 FOR ANYONE [address] [place], [place]  [phone] Fax: [phone] [email or web address] *Interpreters available *Accepts all insurance and uninsured for Urgent Care needs *Accepts Medicaid and uninsured for outpatient treatment (below)      ONLY FOR [facility]  Below:    Outpatient New Patient Assessment/Therapy Walk-ins:        Monday, Wednesday, and Thursday 8am until slots are full (first come, first served)                   New Patient Psychiatry/Medication Management        Monday-Friday 8am-11am (first come, first served)               For all walk-ins we ask that you arrive by 7:15am, because patients will be seen in the order of arrival.

## 2024-02-21 ENCOUNTER — Other Ambulatory Visit: Payer: Self-pay | Admitting: General Practice

## 2024-02-21 DIAGNOSIS — E559 Vitamin D deficiency, unspecified: Secondary | ICD-10-CM

## 2024-03-06 ENCOUNTER — Other Ambulatory Visit: Payer: Self-pay

## 2024-03-06 ENCOUNTER — Encounter (HOSPITAL_COMMUNITY): Payer: Self-pay

## 2024-03-06 ENCOUNTER — Emergency Department (HOSPITAL_COMMUNITY)
Admission: EM | Admit: 2024-03-06 | Discharge: 2024-03-06 | Discharge status: Against Medical Advice | Attending: Emergency Medicine | Admitting: Emergency Medicine

## 2024-03-06 DIAGNOSIS — Z5321 Procedure and treatment not carried out due to patient leaving prior to being seen by health care provider: Secondary | ICD-10-CM | POA: Insufficient documentation

## 2024-03-06 DIAGNOSIS — J029 Acute pharyngitis, unspecified: Secondary | ICD-10-CM | POA: Diagnosis present

## 2024-03-06 NOTE — ED Notes (Signed)
 Pt informed NT she is leaving.

## 2024-03-06 NOTE — ED Triage Notes (Signed)
 Pt states that she had swelling in her throat after eating sushi and took some Benadryl . Pt now states that her throat is burning. No visible swelling and/or redness on assessment. Pt is able to speak in full sentences and denies SOB.
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
## 2024-03-17 ENCOUNTER — Telehealth (HOSPITAL_BASED_OUTPATIENT_CLINIC_OR_DEPARTMENT_OTHER): Admitting: Psychiatry

## 2024-03-17 DIAGNOSIS — E559 Vitamin D deficiency, unspecified: Secondary | ICD-10-CM | POA: Diagnosis not present

## 2024-03-17 DIAGNOSIS — F331 Major depressive disorder, recurrent, moderate: Secondary | ICD-10-CM | POA: Diagnosis not present

## 2024-03-17 DIAGNOSIS — G4700 Insomnia, unspecified: Secondary | ICD-10-CM

## 2024-03-17 DIAGNOSIS — F411 Generalized anxiety disorder: Secondary | ICD-10-CM

## 2024-03-17 DIAGNOSIS — F900 Attention-deficit hyperactivity disorder, predominantly inattentive type: Secondary | ICD-10-CM | POA: Insufficient documentation

## 2024-03-17 DIAGNOSIS — F4310 Post-traumatic stress disorder, unspecified: Secondary | ICD-10-CM

## 2024-03-17 MED ORDER — FLUOXETINE HCL 40 MG PO CAPS
40.0000 mg | ORAL_CAPSULE | Freq: Every day | ORAL | 0 refills | Status: DC
Start: 2024-03-17 — End: 2024-04-21

## 2024-03-17 MED ORDER — PROPRANOLOL HCL 10 MG PO TABS
10.0000 mg | ORAL_TABLET | Freq: Every day | ORAL | 0 refills | Status: AC | PRN
Start: 2024-03-17 — End: 2024-05-16

## 2024-03-17 NOTE — Addendum Note (Signed)
 Addended by: CARVIN CROCK on: 03/17/2024 03:35 PM   Modules accepted: Level of Service

## 2024-03-18 ENCOUNTER — Encounter (HOSPITAL_COMMUNITY): Payer: Self-pay | Admitting: Psychiatry

## 2024-04-08 ENCOUNTER — Encounter: Payer: Self-pay | Admitting: Psychology

## 2024-04-14 ENCOUNTER — Encounter: Payer: Self-pay | Admitting: Emergency Medicine

## 2024-04-14 ENCOUNTER — Ambulatory Visit
Admission: EM | Admit: 2024-04-14 | Discharge: 2024-04-14 | Disposition: A | Discharge status: Home / Self Care | Attending: Nurse Practitioner | Admitting: Nurse Practitioner

## 2024-04-14 DIAGNOSIS — L301 Dyshidrosis [pompholyx]: Secondary | ICD-10-CM

## 2024-04-14 MED ORDER — PREDNISONE 10 MG PO TABS: ORAL_TABLET | ORAL | 0 refills | Status: AC
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
## 2024-04-14 NOTE — Discharge Instructions (Addendum)
 You were seen for multiple red skin lesions on your hands and forearms, with two on the bottom of your right foot. They are tender but not itchy, and there are no signs of active infection. Based on the exam, this most likely represents an inflammatory or viral skin condition such as a dyshidrotic eczema flare or an atypical hand-foot-mouth infection. Continue the topical steroid as directed (apply a thin layer to affected areas; avoid broken skin). Start the prescribed short course of oral steroids; take exactly as directed and with food. Moisturize frequently with a fragrance-free emollient (for example, plain petroleum jelly, Eucerin, CeraVe, or Aquaphor) after handwashing and before bed to protect the skin barrier. Cleanse gently with lukewarm water  and a mild, fragrance-free cleanser; avoid harsh soaps, alcohol-based sanitizers, and excessive hand-washing. If a blister opens, rinse with water , pat dry, and cover with a non-stick bandage; change the dressing daily or if wet/soiled. For discomfort, you may use acetaminophen  or ibuprofen if these are safe for you. Stay well hydrated and rest. To reduce spread if a viral cause is present, do not share drinks, utensils, or towels, and practice careful hand hygiene. Keep any open areas covered while around others. Avoid close contact with people who are pregnant, very young, elderly, or immunocompromised until the skin is healing and there is no drainage.  Arrange follow-up with dermatology or your primary care provider if the rash is not improving within 5-7 days, if new lesions appear, if pain increases, or if you need help adjusting treatment. Seek emergency care now if you develop fever or chills, rapidly spreading redness, pus, warmth or streaking from the lesions, severe or worsening pain, facial or mouth/throat involvement that makes swallowing or breathing difficult, signs of dehydration (very dry mouth, dizziness, minimal urination), or if you feel acutely  ill.

## 2024-04-14 NOTE — ED Triage Notes (Signed)
 Pt c/o blisters on bil arms and hands.  Pt st's she had a tele visit and was dx with Hand, Foot & Mouth but st's her children does not have it and she denies any other symptoms  Pt st's blisters are painful
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
## 2024-04-15 ENCOUNTER — Ambulatory Visit: Payer: Self-pay

## 2024-04-21 ENCOUNTER — Telehealth (HOSPITAL_COMMUNITY): Admitting: Psychiatry

## 2024-04-21 DIAGNOSIS — G4700 Insomnia, unspecified: Secondary | ICD-10-CM | POA: Diagnosis not present

## 2024-04-21 DIAGNOSIS — F411 Generalized anxiety disorder: Secondary | ICD-10-CM | POA: Diagnosis not present

## 2024-04-21 DIAGNOSIS — F4310 Post-traumatic stress disorder, unspecified: Secondary | ICD-10-CM

## 2024-04-21 DIAGNOSIS — F331 Major depressive disorder, recurrent, moderate: Secondary | ICD-10-CM | POA: Diagnosis not present

## 2024-04-21 DIAGNOSIS — E559 Vitamin D deficiency, unspecified: Secondary | ICD-10-CM

## 2024-04-21 MED ORDER — FLUOXETINE HCL 40 MG PO CAPS: 40.0000 mg | ORAL_CAPSULE | Freq: Every day | ORAL | 0 refills | Status: AC

## 2024-04-21 NOTE — Addendum Note (Signed)
 Addended by: CARVIN CROCK on: 04/21/2024 03:30 PM   Modules accepted: Level of Service
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
## 2024-07-07 ENCOUNTER — Telehealth (HOSPITAL_COMMUNITY): Admitting: Psychiatry

## 2024-07-07 ENCOUNTER — Telehealth (HOSPITAL_COMMUNITY): Payer: Self-pay | Admitting: Psychiatry

## 2024-07-07 NOTE — Telephone Encounter (Signed)
 Patient did not show up for the virtual appointment. Attempted to call the patient but received no response. Left a HIPAA compliant voicemail for patient to reschedule.   Ismael Franco, MD PGY-3 Psychiatry Resident

## 2024-08-18 ENCOUNTER — Encounter: Admitting: Psychology
